# Patient Record
Sex: Male | Born: 1940 | Race: Black or African American | Hispanic: No | Marital: Married
Health system: Southern US, Community
[De-identification: ages and names within clinical notes are randomized; demographics above are authoritative.]

---

## 2003-09-18 ENCOUNTER — Ambulatory Visit (HOSPITAL_COMMUNITY): Admission: RE | Admit: 2003-09-18 | Discharge: 2003-09-18 | Payer: Self-pay | Admitting: Orthopedic Surgery

## 2003-09-18 ENCOUNTER — Ambulatory Visit (HOSPITAL_BASED_OUTPATIENT_CLINIC_OR_DEPARTMENT_OTHER): Admission: RE | Admit: 2003-09-18 | Discharge: 2003-09-18 | Payer: Self-pay | Admitting: Orthopedic Surgery

## 2004-12-20 ENCOUNTER — Inpatient Hospital Stay: Payer: Self-pay | Admitting: Internal Medicine

## 2004-12-20 ENCOUNTER — Other Ambulatory Visit: Payer: Self-pay

## 2004-12-21 ENCOUNTER — Other Ambulatory Visit: Payer: Self-pay

## 2004-12-23 ENCOUNTER — Other Ambulatory Visit: Payer: Self-pay

## 2005-02-09 ENCOUNTER — Encounter: Payer: Self-pay | Admitting: Internal Medicine

## 2005-07-14 ENCOUNTER — Other Ambulatory Visit: Payer: Self-pay

## 2005-07-14 ENCOUNTER — Emergency Department: Payer: Self-pay | Admitting: Emergency Medicine

## 2006-03-24 ENCOUNTER — Other Ambulatory Visit: Payer: Self-pay

## 2006-03-24 ENCOUNTER — Inpatient Hospital Stay: Payer: Self-pay | Admitting: Internal Medicine

## 2007-04-17 ENCOUNTER — Ambulatory Visit: Payer: Self-pay | Admitting: Vascular Surgery

## 2008-12-14 ENCOUNTER — Emergency Department: Payer: Self-pay | Admitting: Emergency Medicine

## 2009-02-05 ENCOUNTER — Inpatient Hospital Stay: Payer: Self-pay | Admitting: Internal Medicine

## 2009-03-05 ENCOUNTER — Ambulatory Visit: Payer: Self-pay | Admitting: Internal Medicine

## 2009-11-08 ENCOUNTER — Inpatient Hospital Stay: Payer: Self-pay | Admitting: Internal Medicine

## 2010-01-12 ENCOUNTER — Inpatient Hospital Stay: Payer: Self-pay | Admitting: Internal Medicine

## 2011-01-29 ENCOUNTER — Inpatient Hospital Stay: Payer: Self-pay | Admitting: Internal Medicine

## 2011-03-20 ENCOUNTER — Emergency Department: Payer: Self-pay | Admitting: *Deleted

## 2011-03-30 ENCOUNTER — Ambulatory Visit: Payer: Self-pay | Admitting: Ophthalmology

## 2012-01-15 ENCOUNTER — Emergency Department: Payer: Self-pay | Admitting: Internal Medicine

## 2012-01-15 LAB — COMPREHENSIVE METABOLIC PANEL
Albumin: 3.4 g/dL (ref 3.4–5.0)
Alkaline Phosphatase: 123 U/L (ref 50–136)
Anion Gap: 8 (ref 7–16)
BUN: 17 mg/dL (ref 7–18)
Bilirubin,Total: 0.6 mg/dL (ref 0.2–1.0)
Calcium, Total: 9.2 mg/dL (ref 8.5–10.1)
Co2: 23 mmol/L (ref 21–32)
EGFR (African American): >60
EGFR (Non-African Amer.): 58 — ABNORMAL LOW
Glucose: 368 mg/dL — ABNORMAL HIGH (ref 65–99)
Osmolality: 290 (ref 275–301)
Potassium: 3.7 mmol/L (ref 3.5–5.1)
SGOT(AST): 16 U/L (ref 15–37)

## 2012-01-15 LAB — URINALYSIS, COMPLETE
Bilirubin,UR: NEGATIVE
Blood: NEGATIVE
Glucose,UR: >=500 mg/dL (ref 0–75)
Hyaline Cast: 1
Ketone: NEGATIVE
Nitrite: NEGATIVE
Ph: 5 (ref 4.5–8.0)
RBC,UR: <1 /HPF (ref 0–5)
Specific Gravity: 1.035 (ref 1.003–1.030)
WBC UR: 1 /HPF (ref 0–5)

## 2012-01-15 LAB — CK TOTAL AND CKMB (NOT AT ARMC)
CK, Total: 125 U/L (ref 35–232)
CK-MB: 3.3 ng/mL (ref 0.5–3.6)

## 2012-01-15 LAB — CBC
HCT: 44 % (ref 40.0–52.0)
MCH: 34.3 pg — ABNORMAL HIGH (ref 26.0–34.0)
MCHC: 34.6 g/dL (ref 32.0–36.0)
MCV: 99 fL (ref 80–100)
Platelet: 158 10*3/uL (ref 150–440)
RBC: 4.44 10*6/uL (ref 4.40–5.90)
RDW: 12.9 % (ref 11.5–14.5)
WBC: 5 10*3/uL (ref 3.8–10.6)

## 2012-02-16 ENCOUNTER — Emergency Department: Payer: Self-pay | Admitting: Emergency Medicine

## 2012-02-16 LAB — CBC
HCT: 41.6 % (ref 40.0–52.0)
MCH: 33.5 pg (ref 26.0–34.0)
MCHC: 34.3 g/dL (ref 32.0–36.0)
MCV: 98 fL (ref 80–100)
Platelet: 218 10*3/uL (ref 150–440)
RBC: 4.26 10*6/uL — ABNORMAL LOW (ref 4.40–5.90)
WBC: 6.4 10*3/uL (ref 3.8–10.6)

## 2012-02-16 LAB — COMPREHENSIVE METABOLIC PANEL
Anion Gap: 7 (ref 7–16)
Bilirubin,Total: 0.5 mg/dL (ref 0.2–1.0)
Calcium, Total: 9.2 mg/dL (ref 8.5–10.1)
Chloride: 106 mmol/L (ref 98–107)
Co2: 27 mmol/L (ref 21–32)
Creatinine: 1.02 mg/dL (ref 0.60–1.30)
EGFR (African American): >60
EGFR (Non-African Amer.): >60
Potassium: 3.9 mmol/L (ref 3.5–5.1)
SGOT(AST): 19 U/L (ref 15–37)
SGPT (ALT): 25 U/L
Total Protein: 7.1 g/dL (ref 6.4–8.2)

## 2012-06-22 ENCOUNTER — Inpatient Hospital Stay: Payer: Self-pay | Admitting: Internal Medicine

## 2012-06-22 LAB — CBC WITH DIFFERENTIAL/PLATELET
Basophil %: 1 %
Eosinophil #: 0 10*3/uL (ref 0.0–0.7)
Eosinophil %: 0.6 %
HCT: 44.2 % (ref 40.0–52.0)
HGB: 15.4 g/dL (ref 13.0–18.0)
Lymphocyte #: 1.6 10*3/uL (ref 1.0–3.6)
Lymphocyte %: 20.7 %
MCH: 33.3 pg (ref 26.0–34.0)
MCHC: 34.9 g/dL (ref 32.0–36.0)
MCV: 95 fL (ref 80–100)
Monocyte #: 0.6 x10 3/mm (ref 0.2–1.0)
Neutrophil #: 5.5 10*3/uL (ref 1.4–6.5)
RBC: 4.63 10*6/uL (ref 4.40–5.90)
WBC: 7.8 10*3/uL (ref 3.8–10.6)

## 2012-06-22 LAB — COMPREHENSIVE METABOLIC PANEL
Alkaline Phosphatase: 113 U/L (ref 50–136)
BUN: 21 mg/dL — ABNORMAL HIGH (ref 7–18)
Bilirubin,Total: 1.2 mg/dL — ABNORMAL HIGH (ref 0.2–1.0)
Calcium, Total: 9.3 mg/dL (ref 8.5–10.1)
Chloride: 110 mmol/L — ABNORMAL HIGH (ref 98–107)
Creatinine: 1.01 mg/dL (ref 0.60–1.30)
Osmolality: 291 (ref 275–301)
Potassium: 3.6 mmol/L (ref 3.5–5.1)
SGPT (ALT): 19 U/L (ref 12–78)
Sodium: 143 mmol/L (ref 136–145)
Total Protein: 7.1 g/dL (ref 6.4–8.2)

## 2012-06-22 LAB — CK TOTAL AND CKMB (NOT AT ARMC)
CK, Total: 440 U/L — ABNORMAL HIGH (ref 35–232)
CK-MB: 3.4 ng/mL (ref 0.5–3.6)
CK-MB: 2.8 ng/mL (ref 0.5–3.6)

## 2012-06-22 LAB — TROPONIN I
Troponin-I: <0.02 ng/mL
Troponin-I: <0.02 ng/mL

## 2012-06-22 LAB — ETHANOL: Ethanol: <3 mg/dL

## 2012-06-23 LAB — URINALYSIS, COMPLETE
Bacteria: NONE SEEN
Bilirubin,UR: NEGATIVE
Glucose,UR: NEGATIVE mg/dL (ref 0–75)
Leukocyte Esterase: NEGATIVE
Nitrite: NEGATIVE
Ph: 5 (ref 4.5–8.0)
Protein: 100
RBC,UR: 5 /HPF (ref 0–5)
Specific Gravity: 1.024 (ref 1.003–1.030)
Squamous Epithelial: 1
WBC UR: 1 /HPF (ref 0–5)

## 2012-06-23 LAB — CBC WITH DIFFERENTIAL/PLATELET
Eosinophil %: 0.5 %
HGB: 16.5 g/dL (ref 13.0–18.0)
Lymphocyte %: 25.3 %
MCH: 33.1 pg (ref 26.0–34.0)
MCHC: 34.7 g/dL (ref 32.0–36.0)
MCV: 96 fL (ref 80–100)
Monocyte #: 0.5 x10 3/mm (ref 0.2–1.0)
Monocyte %: 7.2 %
Neutrophil %: 66.1 %
Platelet: 145 10*3/uL — ABNORMAL LOW (ref 150–440)
RBC: 4.97 10*6/uL (ref 4.40–5.90)
WBC: 7.1 10*3/uL (ref 3.8–10.6)

## 2012-06-23 LAB — DRUG SCREEN, URINE

## 2012-06-23 LAB — BASIC METABOLIC PANEL
Anion Gap: 8 (ref 7–16)
BUN: 21 mg/dL — ABNORMAL HIGH (ref 7–18)
Calcium, Total: 9.4 mg/dL (ref 8.5–10.1)
Chloride: 110 mmol/L — ABNORMAL HIGH (ref 98–107)
Co2: 26 mmol/L (ref 21–32)
Creatinine: 0.83 mg/dL (ref 0.60–1.30)
EGFR (African American): >60
EGFR (Non-African Amer.): >60
Glucose: 113 mg/dL — ABNORMAL HIGH (ref 65–99)
Osmolality: 291 (ref 275–301)
Potassium: 3.8 mmol/L (ref 3.5–5.1)
Sodium: 144 mmol/L (ref 136–145)

## 2012-06-23 LAB — LIPID PANEL
Cholesterol: 198 mg/dL (ref 0–200)
HDL Cholesterol: 34 mg/dL — ABNORMAL LOW (ref 40–60)
Ldl Cholesterol, Calc: 137 mg/dL — ABNORMAL HIGH (ref 0–100)
Triglycerides: 133 mg/dL (ref 0–200)
VLDL Cholesterol, Calc: 27 mg/dL (ref 5–40)

## 2012-06-23 LAB — TROPONIN I: Troponin-I: <0.02 ng/mL

## 2012-06-23 LAB — HEMOGLOBIN A1C: Hemoglobin A1C: 8.2 % — ABNORMAL HIGH (ref 4.2–6.3)

## 2012-06-23 LAB — CK TOTAL AND CKMB (NOT AT ARMC)
CK, Total: 344 U/L — ABNORMAL HIGH (ref 35–232)
CK-MB: 3.4 ng/mL (ref 0.5–3.6)

## 2012-06-26 LAB — URINALYSIS, COMPLETE
Glucose,UR: NEGATIVE mg/dL (ref 0–75)
Nitrite: NEGATIVE

## 2012-06-27 LAB — CBC WITH DIFFERENTIAL/PLATELET
Basophil #: 0.1 10*3/uL (ref 0.0–0.1)
Basophil %: 0.4 %
Eosinophil #: 0 10*3/uL (ref 0.0–0.7)
Eosinophil %: 0 %
HGB: 13.9 g/dL (ref 13.0–18.0)
Lymphocyte %: 11.1 %
MCHC: 34.3 g/dL (ref 32.0–36.0)
Neutrophil %: 83.6 %
RBC: 4.24 10*6/uL — ABNORMAL LOW (ref 4.40–5.90)
WBC: 17.9 10*3/uL — ABNORMAL HIGH (ref 3.8–10.6)

## 2012-06-27 LAB — BASIC METABOLIC PANEL
Anion Gap: 7 (ref 7–16)
BUN: 18 mg/dL (ref 7–18)
Chloride: 107 mmol/L (ref 98–107)
Co2: 26 mmol/L (ref 21–32)
Creatinine: 1.43 mg/dL — ABNORMAL HIGH (ref 0.60–1.30)
EGFR (African American): 56 — ABNORMAL LOW
EGFR (Non-African Amer.): 49 — ABNORMAL LOW
Osmolality: 286 (ref 275–301)
Sodium: 140 mmol/L (ref 136–145)

## 2012-06-28 LAB — BASIC METABOLIC PANEL
BUN: 21 mg/dL — ABNORMAL HIGH (ref 7–18)
Calcium, Total: 9.1 mg/dL (ref 8.5–10.1)
Chloride: 107 mmol/L (ref 98–107)
Co2: 24 mmol/L (ref 21–32)
Creatinine: 1.32 mg/dL — ABNORMAL HIGH (ref 0.60–1.30)
EGFR (African American): >60
Potassium: 3.8 mmol/L (ref 3.5–5.1)
Sodium: 139 mmol/L (ref 136–145)

## 2012-06-28 LAB — CBC WITH DIFFERENTIAL/PLATELET
Basophil %: 0.3 %
Eosinophil #: 0 10*3/uL (ref 0.0–0.7)
Eosinophil %: 0.2 %
HGB: 14.3 g/dL (ref 13.0–18.0)
Lymphocyte #: 0.5 10*3/uL — ABNORMAL LOW (ref 1.0–3.6)
MCH: 33.1 pg (ref 26.0–34.0)
MCV: 96 fL (ref 80–100)
Monocyte #: 0.5 x10 3/mm (ref 0.2–1.0)
Monocyte %: 4.3 %
Neutrophil #: 9.6 10*3/uL — ABNORMAL HIGH (ref 1.4–6.5)
Neutrophil %: 90.3 %
RBC: 4.32 10*6/uL — ABNORMAL LOW (ref 4.40–5.90)

## 2012-06-28 LAB — URINE CULTURE

## 2012-06-29 LAB — BASIC METABOLIC PANEL
Anion Gap: 7 (ref 7–16)
BUN: 13 mg/dL (ref 7–18)
Calcium, Total: 8.8 mg/dL (ref 8.5–10.1)
Co2: 24 mmol/L (ref 21–32)
Creatinine: 1.17 mg/dL (ref 0.60–1.30)
EGFR (African American): >60
EGFR (Non-African Amer.): >60
Osmolality: 285 (ref 275–301)
Sodium: 140 mmol/L (ref 136–145)

## 2012-06-30 LAB — BASIC METABOLIC PANEL
Anion Gap: 5 — ABNORMAL LOW (ref 7–16)
BUN: 16 mg/dL (ref 7–18)
Calcium, Total: 8.9 mg/dL (ref 8.5–10.1)
Chloride: 108 mmol/L — ABNORMAL HIGH (ref 98–107)
Co2: 28 mmol/L (ref 21–32)
Creatinine: 1.09 mg/dL (ref 0.60–1.30)
EGFR (African American): >60
EGFR (Non-African Amer.): >60
Glucose: 184 mg/dL — ABNORMAL HIGH (ref 65–99)
Osmolality: 287 (ref 275–301)
Potassium: 3.4 mmol/L — ABNORMAL LOW (ref 3.5–5.1)
Sodium: 141 mmol/L (ref 136–145)

## 2012-07-01 LAB — CULTURE, BLOOD (SINGLE)

## 2012-07-24 ENCOUNTER — Ambulatory Visit: Payer: Self-pay | Admitting: Vascular Surgery

## 2012-07-24 LAB — BASIC METABOLIC PANEL
Anion Gap: 7 (ref 7–16)
BUN: 13 mg/dL (ref 7–18)
Chloride: 104 mmol/L (ref 98–107)
Creatinine: 1.08 mg/dL (ref 0.60–1.30)
EGFR (Non-African Amer.): >60
Glucose: 136 mg/dL — ABNORMAL HIGH (ref 65–99)
Osmolality: 282 (ref 275–301)
Potassium: 4.4 mmol/L (ref 3.5–5.1)
Sodium: 140 mmol/L (ref 136–145)

## 2012-08-21 ENCOUNTER — Ambulatory Visit: Payer: Self-pay | Admitting: Vascular Surgery

## 2012-08-21 LAB — BASIC METABOLIC PANEL
Anion Gap: 10 (ref 7–16)
BUN: 20 mg/dL — ABNORMAL HIGH (ref 7–18)
Calcium, Total: 9.3 mg/dL (ref 8.5–10.1)
Chloride: 106 mmol/L (ref 98–107)
Creatinine: 1.09 mg/dL (ref 0.60–1.30)
EGFR (African American): >60
EGFR (Non-African Amer.): >60
Glucose: 206 mg/dL — ABNORMAL HIGH (ref 65–99)
Osmolality: 290 (ref 275–301)
Potassium: 3.9 mmol/L (ref 3.5–5.1)

## 2012-08-22 ENCOUNTER — Ambulatory Visit: Payer: Self-pay | Admitting: Vascular Surgery

## 2012-08-22 LAB — BASIC METABOLIC PANEL
Anion Gap: 7 (ref 7–16)
BUN: 19 mg/dL — ABNORMAL HIGH (ref 7–18)
Calcium, Total: 9.4 mg/dL (ref 8.5–10.1)
Chloride: 107 mmol/L (ref 98–107)
Co2: 28 mmol/L (ref 21–32)
Creatinine: 1.08 mg/dL (ref 0.60–1.30)
EGFR (African American): >60
EGFR (Non-African Amer.): >60
Glucose: 113 mg/dL — ABNORMAL HIGH (ref 65–99)
Osmolality: 286 (ref 275–301)

## 2012-08-22 LAB — CBC
HCT: 37.8 % — ABNORMAL LOW (ref 40.0–52.0)
MCH: 32.2 pg (ref 26.0–34.0)
MCV: 95 fL (ref 80–100)
Platelet: 282 10*3/uL (ref 150–440)
RDW: 13.4 % (ref 11.5–14.5)

## 2012-08-24 ENCOUNTER — Ambulatory Visit: Payer: Self-pay | Admitting: Vascular Surgery

## 2012-08-30 ENCOUNTER — Other Ambulatory Visit: Payer: Self-pay | Admitting: Podiatry

## 2012-09-04 LAB — WOUND CULTURE

## 2012-09-05 ENCOUNTER — Inpatient Hospital Stay: Payer: Self-pay | Admitting: Vascular Surgery

## 2012-09-06 LAB — BASIC METABOLIC PANEL
BUN: 14 mg/dL (ref 7–18)
Chloride: 103 mmol/L (ref 98–107)
Co2: 27 mmol/L (ref 21–32)
EGFR (African American): >60
EGFR (Non-African Amer.): >60
Glucose: 230 mg/dL — ABNORMAL HIGH (ref 65–99)
Osmolality: 282 (ref 275–301)
Sodium: 137 mmol/L (ref 136–145)

## 2012-09-06 LAB — CBC WITH DIFFERENTIAL/PLATELET
Basophil %: 0.3 %
Eosinophil #: 0.2 10*3/uL (ref 0.0–0.7)
Eosinophil %: 2 %
HCT: 27.3 % — ABNORMAL LOW (ref 40.0–52.0)
HGB: 9.1 g/dL — ABNORMAL LOW (ref 13.0–18.0)
Lymphocyte #: 1.3 10*3/uL (ref 1.0–3.6)
Lymphocyte %: 13.3 %
MCHC: 33.4 g/dL (ref 32.0–36.0)
Monocyte %: 7.5 %
Neutrophil #: 7.3 10*3/uL — ABNORMAL HIGH (ref 1.4–6.5)
Neutrophil %: 76.9 %
RBC: 2.86 10*6/uL — ABNORMAL LOW (ref 4.40–5.90)
WBC: 9.5 10*3/uL (ref 3.8–10.6)

## 2012-09-09 LAB — BASIC METABOLIC PANEL
Anion Gap: 9 (ref 7–16)
Calcium, Total: 8.9 mg/dL (ref 8.5–10.1)
Co2: 26 mmol/L (ref 21–32)
Creatinine: 0.96 mg/dL (ref 0.60–1.30)
EGFR (African American): >60
Glucose: 86 mg/dL (ref 65–99)
Osmolality: 282 (ref 275–301)

## 2012-09-09 LAB — CBC WITH DIFFERENTIAL/PLATELET
Basophil #: 0.1 10*3/uL (ref 0.0–0.1)
Eosinophil #: 0.1 10*3/uL (ref 0.0–0.7)
Eosinophil %: 1.1 %
Lymphocyte #: 1.8 10*3/uL (ref 1.0–3.6)
Lymphocyte %: 16.5 %
MCHC: 32.9 g/dL (ref 32.0–36.0)
MCV: 95 fL (ref 80–100)
Monocyte #: 0.8 x10 3/mm (ref 0.2–1.0)
Monocyte %: 6.9 %
Neutrophil %: 75 %
Platelet: 499 10*3/uL — ABNORMAL HIGH (ref 150–440)
RBC: 2.71 10*6/uL — ABNORMAL LOW (ref 4.40–5.90)
RDW: 13.7 % (ref 11.5–14.5)

## 2012-09-11 LAB — CBC WITH DIFFERENTIAL/PLATELET
Basophil #: 0 10*3/uL (ref 0.0–0.1)
Basophil %: 0.4 %
Eosinophil #: 0.1 10*3/uL (ref 0.0–0.7)
Eosinophil %: 1.1 %
HGB: 9 g/dL — ABNORMAL LOW (ref 13.0–18.0)
Lymphocyte #: 1.4 10*3/uL (ref 1.0–3.6)
Lymphocyte %: 13.9 %
MCH: 32 pg (ref 26.0–34.0)
MCHC: 33.9 g/dL (ref 32.0–36.0)
Monocyte #: 0.7 x10 3/mm (ref 0.2–1.0)
Monocyte %: 7.5 %
Neutrophil #: 7.7 10*3/uL — ABNORMAL HIGH (ref 1.4–6.5)
Neutrophil %: 77.1 %
Platelet: 523 10*3/uL — ABNORMAL HIGH (ref 150–440)
RBC: 2.8 10*6/uL — ABNORMAL LOW (ref 4.40–5.90)
RDW: 13.5 % (ref 11.5–14.5)

## 2012-09-11 LAB — BASIC METABOLIC PANEL
Anion Gap: 9 (ref 7–16)
BUN: 9 mg/dL (ref 7–18)
Chloride: 104 mmol/L (ref 98–107)
Co2: 26 mmol/L (ref 21–32)
Creatinine: 0.9 mg/dL (ref 0.60–1.30)
EGFR (African American): >60
EGFR (Non-African Amer.): >60
Glucose: 153 mg/dL — ABNORMAL HIGH (ref 65–99)
Osmolality: 279 (ref 275–301)
Potassium: 3.8 mmol/L (ref 3.5–5.1)
Sodium: 139 mmol/L (ref 136–145)

## 2012-09-11 LAB — PATHOLOGY REPORT

## 2012-09-23 ENCOUNTER — Ambulatory Visit: Payer: Self-pay | Admitting: Internal Medicine

## 2012-10-07 ENCOUNTER — Inpatient Hospital Stay: Payer: Self-pay | Admitting: Internal Medicine

## 2012-10-07 LAB — URINALYSIS, COMPLETE
Bilirubin,UR: NEGATIVE
Glucose,UR: 50 mg/dL (ref 0–75)
Ketone: NEGATIVE
Leukocyte Esterase: NEGATIVE
Nitrite: NEGATIVE
Protein: 100
RBC,UR: 2 /HPF (ref 0–5)
Specific Gravity: 1.025 (ref 1.003–1.030)

## 2012-10-07 LAB — COMPREHENSIVE METABOLIC PANEL
Albumin: 2.1 g/dL — ABNORMAL LOW (ref 3.4–5.0)
Anion Gap: 8 (ref 7–16)
BUN: 23 mg/dL — ABNORMAL HIGH (ref 7–18)
Bilirubin,Total: 0.8 mg/dL (ref 0.2–1.0)
Calcium, Total: 9.5 mg/dL (ref 8.5–10.1)
Chloride: 103 mmol/L (ref 98–107)
Creatinine: 1.09 mg/dL (ref 0.60–1.30)
EGFR (African American): >60
Glucose: 221 mg/dL — ABNORMAL HIGH (ref 65–99)
Osmolality: 286 (ref 275–301)
SGOT(AST): 152 U/L — ABNORMAL HIGH (ref 15–37)
SGPT (ALT): 136 U/L — ABNORMAL HIGH (ref 12–78)

## 2012-10-07 LAB — CK TOTAL AND CKMB (NOT AT ARMC)
CK, Total: 1633 U/L — ABNORMAL HIGH (ref 35–232)
CK-MB: 5.9 ng/mL — ABNORMAL HIGH (ref 0.5–3.6)

## 2012-10-07 LAB — CBC
HGB: 9.8 g/dL — ABNORMAL LOW (ref 13.0–18.0)
MCH: 30.7 pg (ref 26.0–34.0)
MCHC: 32.4 g/dL (ref 32.0–36.0)
MCV: 95 fL (ref 80–100)
Platelet: 343 10*3/uL (ref 150–440)
RBC: 3.2 10*6/uL — ABNORMAL LOW (ref 4.40–5.90)
WBC: 19.2 10*3/uL — ABNORMAL HIGH (ref 3.8–10.6)

## 2012-10-07 LAB — MAGNESIUM: Magnesium: 2.1 mg/dL

## 2012-10-08 LAB — BASIC METABOLIC PANEL
Creatinine: 1.07 mg/dL (ref 0.60–1.30)
EGFR (Non-African Amer.): >60
Glucose: 225 mg/dL — ABNORMAL HIGH (ref 65–99)

## 2012-10-08 LAB — CBC WITH DIFFERENTIAL/PLATELET
Basophil #: 0.1 10*3/uL (ref 0.0–0.1)
Eosinophil #: 0 10*3/uL (ref 0.0–0.7)
Eosinophil %: 0 %
Lymphocyte #: 1.5 10*3/uL (ref 1.0–3.6)
Lymphocyte %: 8.6 %
MCHC: 32.4 g/dL (ref 32.0–36.0)
MCV: 94 fL (ref 80–100)
Monocyte #: 1.2 x10 3/mm — ABNORMAL HIGH (ref 0.2–1.0)
Monocyte %: 6.6 %
Neutrophil #: 14.8 10*3/uL — ABNORMAL HIGH (ref 1.4–6.5)
Neutrophil %: 84.3 %
RBC: 2.95 10*6/uL — ABNORMAL LOW (ref 4.40–5.90)
WBC: 17.6 10*3/uL — ABNORMAL HIGH (ref 3.8–10.6)

## 2012-10-08 LAB — CK TOTAL AND CKMB (NOT AT ARMC): CK, Total: 1044 U/L — ABNORMAL HIGH (ref 35–232)

## 2012-10-08 LAB — TROPONIN I
Troponin-I: <0.02 ng/mL
Troponin-I: <0.02 ng/mL

## 2012-10-09 LAB — CBC WITH DIFFERENTIAL/PLATELET
Basophil #: 0.1 10*3/uL (ref 0.0–0.1)
Eosinophil %: 0.1 %
HCT: 25.4 % — ABNORMAL LOW (ref 40.0–52.0)
HGB: 8.5 g/dL — ABNORMAL LOW (ref 13.0–18.0)
Lymphocyte #: 1.2 10*3/uL (ref 1.0–3.6)
MCH: 31.4 pg (ref 26.0–34.0)
MCHC: 33.3 g/dL (ref 32.0–36.0)
MCV: 95 fL (ref 80–100)
Monocyte #: 0.8 x10 3/mm (ref 0.2–1.0)
Monocyte %: 4.7 %
Neutrophil %: 87.7 %
WBC: 16.7 10*3/uL — ABNORMAL HIGH (ref 3.8–10.6)

## 2012-10-09 LAB — CK: CK, Total: 691 U/L — ABNORMAL HIGH (ref 35–232)

## 2012-10-10 LAB — CBC WITH DIFFERENTIAL/PLATELET
Basophil #: 0 10*3/uL (ref 0.0–0.1)
Basophil %: 0.3 %
HCT: 23.1 % — ABNORMAL LOW (ref 40.0–52.0)
Lymphocyte #: 1.4 10*3/uL (ref 1.0–3.6)
Lymphocyte %: 10.9 %
MCH: 30.1 pg (ref 26.0–34.0)
MCHC: 32 g/dL (ref 32.0–36.0)
Monocyte #: 1 x10 3/mm (ref 0.2–1.0)
Neutrophil %: 80.6 %
Platelet: 336 10*3/uL (ref 150–440)
RBC: 2.46 10*6/uL — ABNORMAL LOW (ref 4.40–5.90)
WBC: 12.5 10*3/uL — ABNORMAL HIGH (ref 3.8–10.6)

## 2012-10-10 LAB — VANCOMYCIN, TROUGH: Vancomycin, Trough: 15 ug/mL (ref 10–20)

## 2012-10-11 LAB — CBC WITH DIFFERENTIAL/PLATELET
Basophil %: 0.3 %
Eosinophil #: 0.1 10*3/uL (ref 0.0–0.7)
Eosinophil %: 0.5 %
Lymphocyte #: 2 10*3/uL (ref 1.0–3.6)
MCH: 29.4 pg (ref 26.0–34.0)
MCV: 94 fL (ref 80–100)
Neutrophil #: 12.4 10*3/uL — ABNORMAL HIGH (ref 1.4–6.5)
Neutrophil %: 80.3 %
RBC: 2.46 10*6/uL — ABNORMAL LOW (ref 4.40–5.90)
RDW: 15.3 % — ABNORMAL HIGH (ref 11.5–14.5)
WBC: 15.5 10*3/uL — ABNORMAL HIGH (ref 3.8–10.6)

## 2012-10-12 LAB — CBC WITH DIFFERENTIAL/PLATELET
Eosinophil #: 0 10*3/uL (ref 0.0–0.7)
Eosinophil %: 0.2 %
HCT: 21.6 % — ABNORMAL LOW (ref 40.0–52.0)
MCH: 30.2 pg (ref 26.0–34.0)
MCHC: 31.9 g/dL — ABNORMAL LOW (ref 32.0–36.0)
Monocyte #: 0.8 x10 3/mm (ref 0.2–1.0)
Monocyte %: 5.2 %
RBC: 2.28 10*6/uL — ABNORMAL LOW (ref 4.40–5.90)
RDW: 15.4 % — ABNORMAL HIGH (ref 11.5–14.5)
WBC: 15.5 10*3/uL — ABNORMAL HIGH (ref 3.8–10.6)

## 2012-10-12 LAB — BASIC METABOLIC PANEL
Anion Gap: 7 (ref 7–16)
Calcium, Total: 7.8 mg/dL — ABNORMAL LOW (ref 8.5–10.1)
Co2: 23 mmol/L (ref 21–32)
EGFR (African American): 50 — ABNORMAL LOW
EGFR (Non-African Amer.): 43 — ABNORMAL LOW
Glucose: 406 mg/dL — ABNORMAL HIGH (ref 65–99)
Potassium: 3.3 mmol/L — ABNORMAL LOW (ref 3.5–5.1)
Sodium: 148 mmol/L — ABNORMAL HIGH (ref 136–145)

## 2012-10-12 LAB — WOUND CULTURE

## 2012-10-13 LAB — BASIC METABOLIC PANEL
Anion Gap: 8 (ref 7–16)
Chloride: 114 mmol/L — ABNORMAL HIGH (ref 98–107)
Co2: 23 mmol/L (ref 21–32)
EGFR (African American): 44 — ABNORMAL LOW
EGFR (Non-African Amer.): 38 — ABNORMAL LOW
Glucose: 385 mg/dL — ABNORMAL HIGH (ref 65–99)
Osmolality: 309 (ref 275–301)
Sodium: 145 mmol/L (ref 136–145)

## 2012-10-13 LAB — CBC WITH DIFFERENTIAL/PLATELET
Basophil #: 0 10*3/uL (ref 0.0–0.1)
Basophil %: 0.2 %
Eosinophil #: 1.2 10*3/uL — ABNORMAL HIGH (ref 0.0–0.7)
Eosinophil %: 7.2 %
MCH: 31.7 pg (ref 26.0–34.0)
MCV: 93 fL (ref 80–100)
Monocyte #: 0.5 x10 3/mm (ref 0.2–1.0)
Monocyte %: 3.2 %
Neutrophil #: 13.7 10*3/uL — ABNORMAL HIGH (ref 1.4–6.5)
Neutrophil %: 82.2 %
Platelet: 381 10*3/uL (ref 150–440)
RBC: 2.45 10*6/uL — ABNORMAL LOW (ref 4.40–5.90)
RDW: 16.6 % — ABNORMAL HIGH (ref 11.5–14.5)

## 2012-10-13 LAB — IRON AND TIBC
Iron Bind.Cap.(Total): 93 ug/dL — ABNORMAL LOW (ref 250–450)
Iron Saturation: 26 %
Iron: 24 ug/dL — ABNORMAL LOW (ref 65–175)
Unbound Iron-Bind.Cap.: 69 ug/dL

## 2012-10-13 LAB — PROTIME-INR
INR: 1.3
Prothrombin Time: 16.3 secs — ABNORMAL HIGH (ref 11.5–14.7)

## 2012-10-13 LAB — CULTURE, BLOOD (SINGLE)

## 2012-10-14 LAB — CBC WITH DIFFERENTIAL/PLATELET
Basophil #: 0 10*3/uL (ref 0.0–0.1)
Basophil %: 0.2 %
Eosinophil #: 0.1 10*3/uL (ref 0.0–0.7)
HGB: 7.7 g/dL — ABNORMAL LOW (ref 13.0–18.0)
Lymphocyte #: 1.7 10*3/uL (ref 1.0–3.6)
Lymphocyte %: 9 %
MCH: 29.5 pg (ref 26.0–34.0)
MCHC: 31.6 g/dL — ABNORMAL LOW (ref 32.0–36.0)
Monocyte #: 0.8 x10 3/mm (ref 0.2–1.0)
Neutrophil %: 86.2 %
Platelet: 440 10*3/uL (ref 150–440)
RBC: 2.62 10*6/uL — ABNORMAL LOW (ref 4.40–5.90)
RDW: 16.3 % — ABNORMAL HIGH (ref 11.5–14.5)
WBC: 18.9 10*3/uL — ABNORMAL HIGH (ref 3.8–10.6)

## 2012-10-14 LAB — PROTIME-INR
INR: 1.3
Prothrombin Time: 16.4 s — ABNORMAL HIGH

## 2012-10-14 LAB — BASIC METABOLIC PANEL
Calcium, Total: 8.6 mg/dL (ref 8.5–10.1)
Chloride: 117 mmol/L — ABNORMAL HIGH (ref 98–107)
EGFR (African American): 35 — ABNORMAL LOW
EGFR (Non-African Amer.): 30 — ABNORMAL LOW
Glucose: 357 mg/dL — ABNORMAL HIGH (ref 65–99)
Sodium: 149 mmol/L — ABNORMAL HIGH (ref 136–145)

## 2012-10-15 LAB — URINALYSIS, COMPLETE
Leukocyte Esterase: NEGATIVE
Nitrite: NEGATIVE
Ph: 5 (ref 4.5–8.0)
RBC,UR: 2 /HPF (ref 0–5)
Specific Gravity: 1.012 (ref 1.003–1.030)
Squamous Epithelial: <1

## 2012-10-15 LAB — BASIC METABOLIC PANEL
Anion Gap: 6 — ABNORMAL LOW (ref 7–16)
Co2: 23 mmol/L (ref 21–32)
Creatinine: 2.14 mg/dL — ABNORMAL HIGH (ref 0.60–1.30)
EGFR (African American): 35 — ABNORMAL LOW
EGFR (Non-African Amer.): 30 — ABNORMAL LOW
Glucose: 549 mg/dL (ref 65–99)
Sodium: 135 mmol/L — ABNORMAL LOW (ref 136–145)

## 2012-10-15 LAB — CBC WITH DIFFERENTIAL/PLATELET
Basophil #: 0 10*3/uL (ref 0.0–0.1)
Eosinophil #: 0.1 10*3/uL (ref 0.0–0.7)
Eosinophil %: 0.3 %
HCT: 23.2 % — ABNORMAL LOW (ref 40.0–52.0)
HGB: 6.9 g/dL — ABNORMAL LOW (ref 13.0–18.0)
Lymphocyte #: 1.7 10*3/uL (ref 1.0–3.6)
MCH: 28.5 pg (ref 26.0–34.0)
Monocyte #: 0.6 x10 3/mm (ref 0.2–1.0)
Monocyte %: 3.1 %
Neutrophil #: 17.1 10*3/uL — ABNORMAL HIGH (ref 1.4–6.5)
RBC: 2.43 10*6/uL — ABNORMAL LOW (ref 4.40–5.90)
RDW: 16.7 % — ABNORMAL HIGH (ref 11.5–14.5)
WBC: 19.5 10*3/uL — ABNORMAL HIGH (ref 3.8–10.6)

## 2012-10-15 LAB — PROTEIN / CREATININE RATIO, URINE
Creatinine, Urine: 80.5 mg/dL (ref 30.0–125.0)
Protein, Random Urine: 127 mg/dL — ABNORMAL HIGH (ref 0–12)
Protein/Creat. Ratio: 1578 mg/gCREAT — ABNORMAL HIGH (ref 0–200)

## 2012-10-15 LAB — PROTIME-INR: INR: 1.4

## 2012-10-16 LAB — CBC WITH DIFFERENTIAL/PLATELET
Eosinophil #: 0.1 10*3/uL (ref 0.0–0.7)
Eosinophil %: 0.7 %
HCT: 22.3 % — ABNORMAL LOW (ref 40.0–52.0)
HGB: 7.3 g/dL — ABNORMAL LOW (ref 13.0–18.0)
Lymphocyte #: 1.5 10*3/uL (ref 1.0–3.6)
MCH: 30 pg (ref 26.0–34.0)
MCHC: 32.7 g/dL (ref 32.0–36.0)
MCV: 92 fL (ref 80–100)
Neutrophil #: 16.4 10*3/uL — ABNORMAL HIGH (ref 1.4–6.5)
Neutrophil %: 86.3 %
Platelet: 372 10*3/uL (ref 150–440)
RBC: 2.43 10*6/uL — ABNORMAL LOW (ref 4.40–5.90)
WBC: 19 10*3/uL — ABNORMAL HIGH (ref 3.8–10.6)

## 2012-10-16 LAB — BASIC METABOLIC PANEL
BUN: 31 mg/dL — ABNORMAL HIGH (ref 7–18)
Chloride: 117 mmol/L — ABNORMAL HIGH (ref 98–107)
Co2: 24 mmol/L (ref 21–32)
Creatinine: 2.69 mg/dL — ABNORMAL HIGH (ref 0.60–1.30)
EGFR (Non-African Amer.): 23 — ABNORMAL LOW
Glucose: 123 mg/dL — ABNORMAL HIGH (ref 65–99)
Osmolality: 302 (ref 275–301)
Potassium: 4 mmol/L (ref 3.5–5.1)

## 2012-10-16 LAB — PROTEIN ELECTROPHORESIS(ARMC)

## 2012-10-16 LAB — PROTIME-INR
INR: 1.6
Prothrombin Time: 19.1 secs — ABNORMAL HIGH (ref 11.5–14.7)

## 2012-10-16 LAB — VANCOMYCIN, RANDOM: Vancomycin, Random: 18 ug/mL

## 2012-10-17 LAB — BASIC METABOLIC PANEL
Anion Gap: 7 (ref 7–16)
BUN: 38 mg/dL — ABNORMAL HIGH (ref 7–18)
Calcium, Total: 8.4 mg/dL — ABNORMAL LOW (ref 8.5–10.1)
Chloride: 120 mmol/L — ABNORMAL HIGH (ref 98–107)
Co2: 24 mmol/L (ref 21–32)
Creatinine: 2.96 mg/dL — ABNORMAL HIGH (ref 0.60–1.30)
EGFR (African American): 23 — ABNORMAL LOW
EGFR (Non-African Amer.): 20 — ABNORMAL LOW
Glucose: 190 mg/dL — ABNORMAL HIGH (ref 65–99)
Osmolality: 314 (ref 275–301)
Sodium: 151 mmol/L — ABNORMAL HIGH (ref 136–145)

## 2012-10-17 LAB — CBC WITH DIFFERENTIAL/PLATELET
Basophil %: 0.3 %
Eosinophil %: 0.3 %
HGB: 7.4 g/dL — ABNORMAL LOW (ref 13.0–18.0)
Lymphocyte #: 1.5 10*3/uL (ref 1.0–3.6)
Lymphocyte %: 8.9 %
MCH: 30.6 pg (ref 26.0–34.0)
Monocyte #: 0.8 x10 3/mm (ref 0.2–1.0)
Monocyte %: 4.7 %
Platelet: 412 10*3/uL (ref 150–440)
RBC: 2.43 10*6/uL — ABNORMAL LOW (ref 4.40–5.90)
WBC: 17.3 10*3/uL — ABNORMAL HIGH (ref 3.8–10.6)

## 2012-10-17 LAB — PROTIME-INR
INR: 1.6
Prothrombin Time: 19.1 secs — ABNORMAL HIGH (ref 11.5–14.7)

## 2012-10-17 LAB — UR PROT ELECTROPHORESIS, URINE RANDOM

## 2012-10-18 LAB — CBC WITH DIFFERENTIAL/PLATELET
Basophil %: 0.3 %
Eosinophil %: 0.7 %
Lymphocyte #: 1.6 10*3/uL (ref 1.0–3.6)
MCH: 29.8 pg (ref 26.0–34.0)
MCHC: 32 g/dL (ref 32.0–36.0)
MCV: 93 fL (ref 80–100)
Monocyte #: 0.8 x10 3/mm (ref 0.2–1.0)
Monocyte %: 5 %
Neutrophil %: 84 %
RBC: 2.41 10*6/uL — ABNORMAL LOW (ref 4.40–5.90)
WBC: 16.3 10*3/uL — ABNORMAL HIGH (ref 3.8–10.6)

## 2012-10-18 LAB — COMPREHENSIVE METABOLIC PANEL
Alkaline Phosphatase: 209 U/L — ABNORMAL HIGH (ref 50–136)
Anion Gap: 5 — ABNORMAL LOW (ref 7–16)
BUN: 35 mg/dL — ABNORMAL HIGH (ref 7–18)
Bilirubin,Total: 0.4 mg/dL (ref 0.2–1.0)
Calcium, Total: 8.3 mg/dL — ABNORMAL LOW (ref 8.5–10.1)
Chloride: 116 mmol/L — ABNORMAL HIGH (ref 98–107)
Co2: 24 mmol/L (ref 21–32)
Creatinine: 3.01 mg/dL — ABNORMAL HIGH (ref 0.60–1.30)
EGFR (African American): 23 — ABNORMAL LOW
EGFR (Non-African Amer.): 20 — ABNORMAL LOW
Glucose: 231 mg/dL — ABNORMAL HIGH (ref 65–99)
Osmolality: 304 (ref 275–301)
Potassium: 3.6 mmol/L (ref 3.5–5.1)
SGOT(AST): 81 U/L — ABNORMAL HIGH (ref 15–37)
SGPT (ALT): 80 U/L — ABNORMAL HIGH (ref 12–78)
Sodium: 145 mmol/L (ref 136–145)
Total Protein: 6.2 g/dL — ABNORMAL LOW (ref 6.4–8.2)

## 2012-10-18 LAB — PROTIME-INR: INR: 1.5

## 2012-10-24 ENCOUNTER — Ambulatory Visit: Payer: Self-pay | Admitting: Internal Medicine

## 2012-10-24 DEATH — deceased

## 2014-04-03 IMAGING — CR RIGHT FOOT COMPLETE - 3+ VIEW
1 series · 3 of 3 positions shown · non-contrast
Comparison: none

REASON FOR EXAM: first toe ulcer
COMMENTS:

PROCEDURE:     DXR - DXR FOOT RT COMPLETE W/OBLIQUES  - October 07, 2012  [DATE]
RESULT:     Comparison: 02/16/2012

[Series 1: oblique · 0.17mm/px · 3 of 3 slices shown]
[im 1/3]
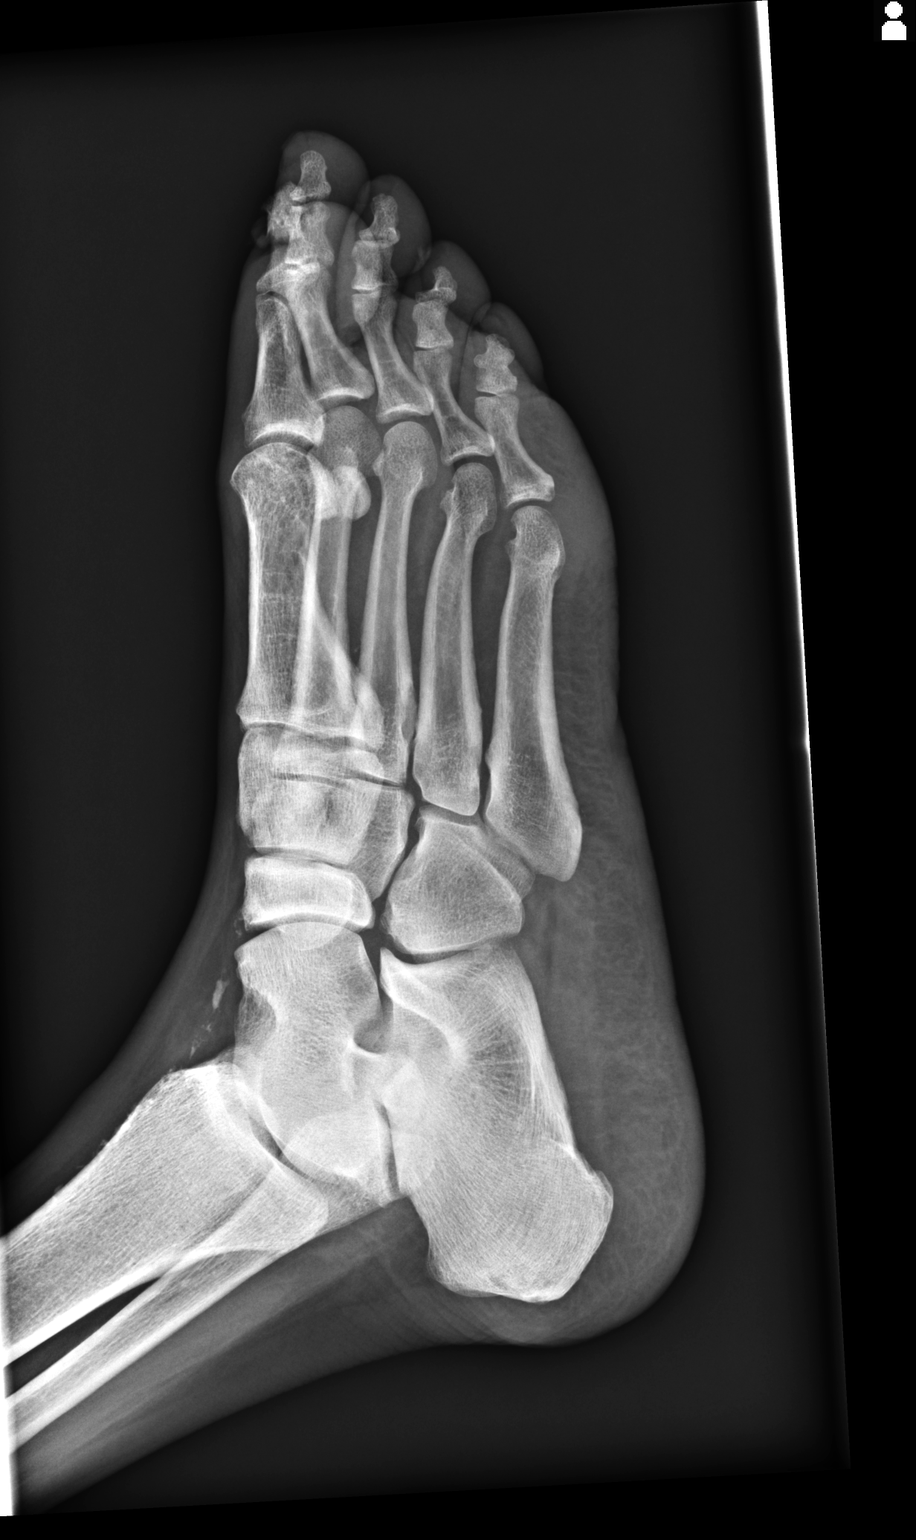
[im 2/3]
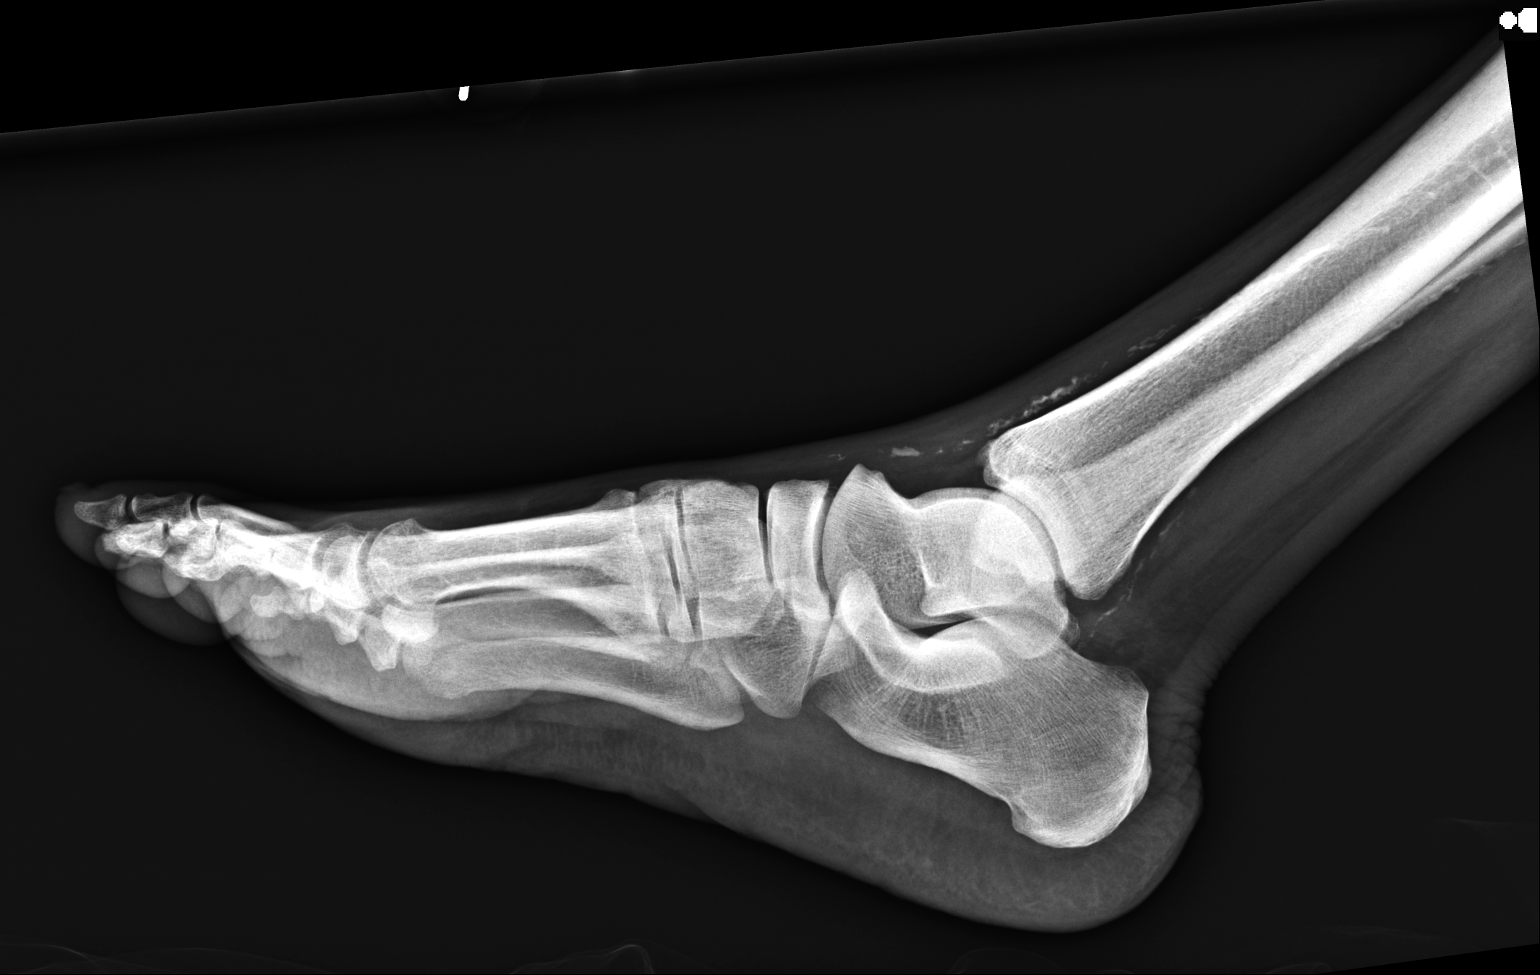
[im 3/3]
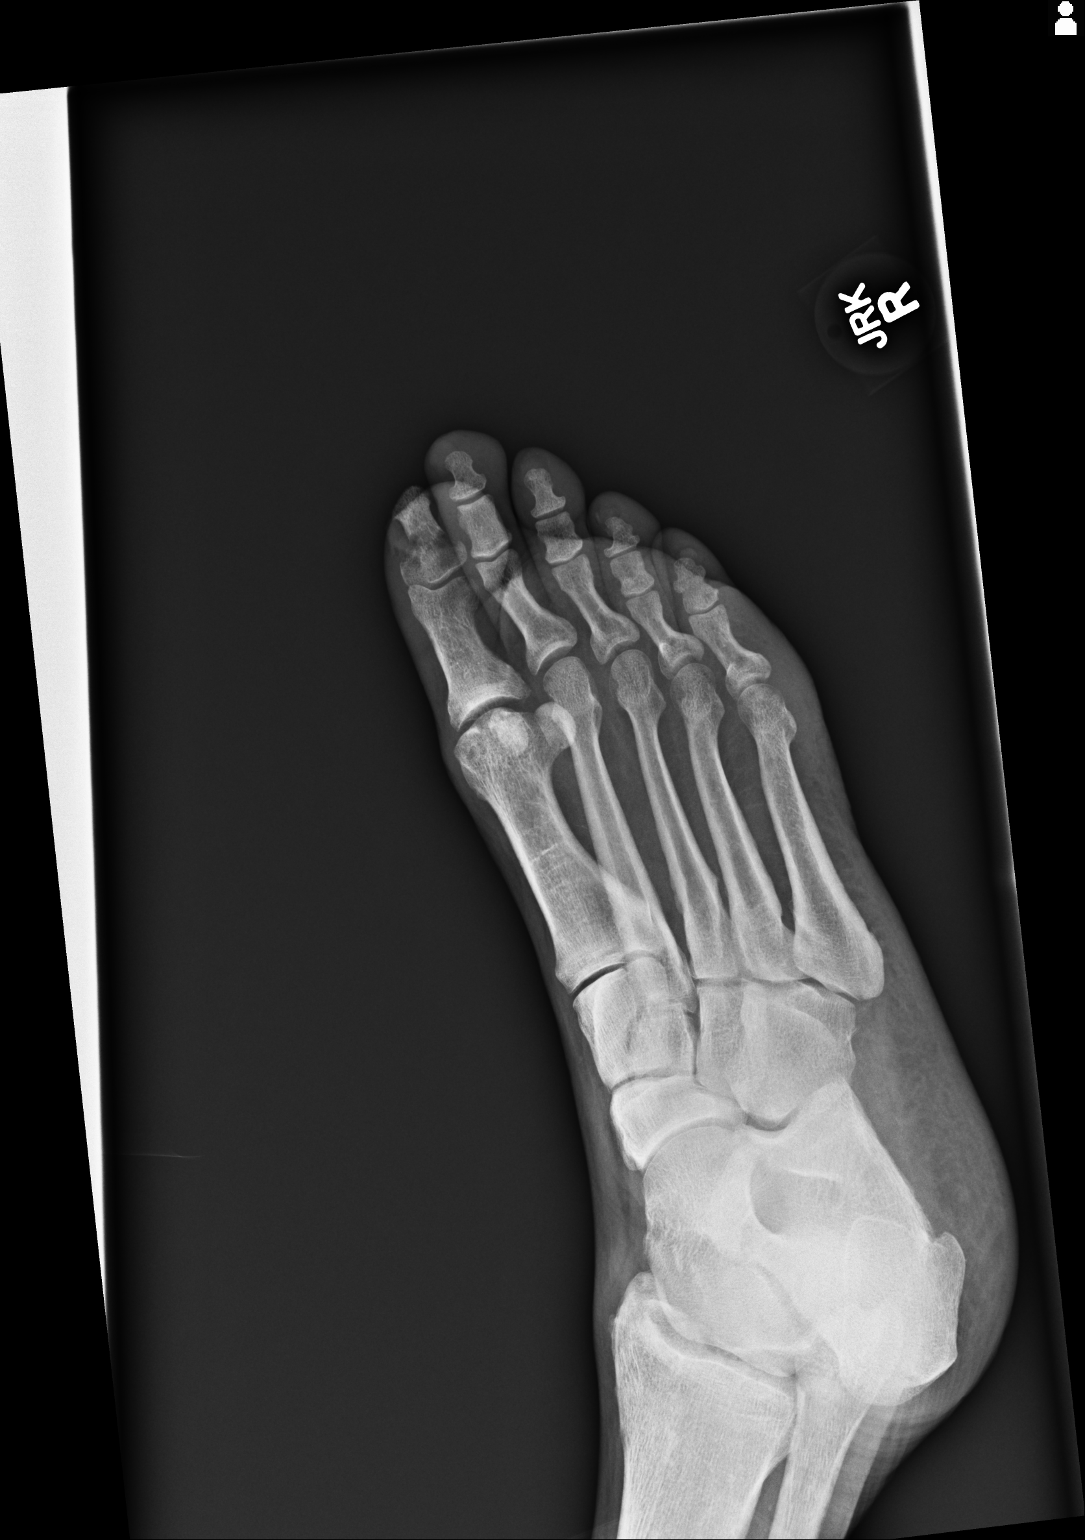

[3 of 3 positions shown; findings below may reference images not displayed]

FINDINGS: There is cortical destruction and lucency involving the mid portion of the
distal phalanx of the great toe. The findings are consistent with
osteomyelitis given the reported great toe ulcer. No soft tissue gas seen.
There are vascular calcifications.
IMPRESSION: Findings consistent with osteomyelitis of the distal phalanx of the great
toe.

[REDACTED]

## 2014-04-03 IMAGING — CT CT CHEST W/ CM
2 series · 15 of 31 positions shown, 19 images · IV contrast (APPLIED)
Comparison: none

REASON FOR EXAM: tachypnea, hypoxia
COMMENTS:

PROCEDURE:     CT  - CT CHEST (FOR PE) W  - October 07, 2012  [DATE]
RESULT:     Comparison: None
TECHNIQUE: Multiple thin section axial images were obtained from the lung
apices to the upper abdomen following 80 ml Isovue 370 intravenous contrast,
according to the PE protocol. These images were also reviewed on a Siemens
multiplanar work station.

[Series 4: soft tissue · axial · 0.73mm/px · z∈[-276,-231]mm · 2 of 99 slices shown]
[im 8/99  mediastinal]
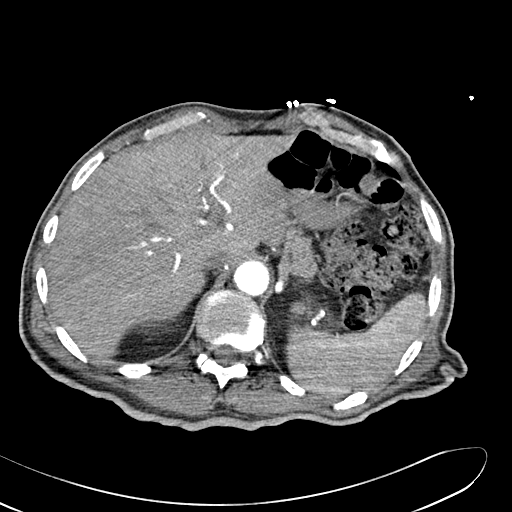
[im 23/99  mediastinal]
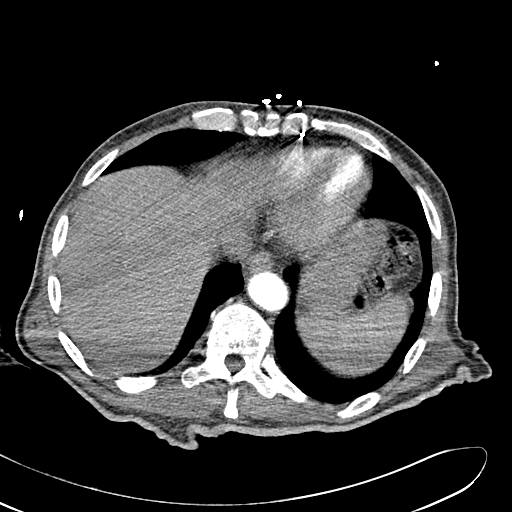

[Series 5: lung windows · axial · 0.73mm/px · z∈[-270,-27]mm · 13 of 97 slices shown, 17 images]
[im 8/97  mediastinal]
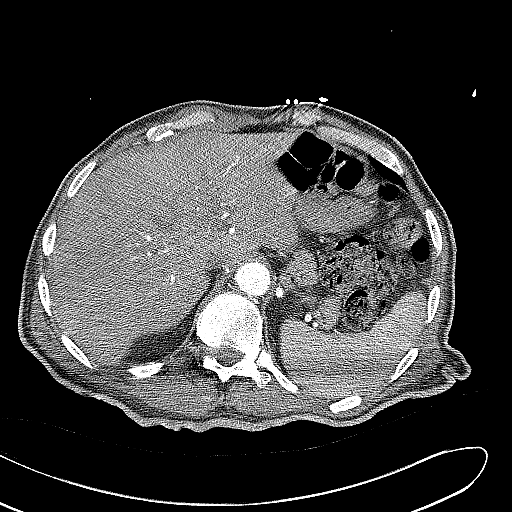
[im 8/97  lung]
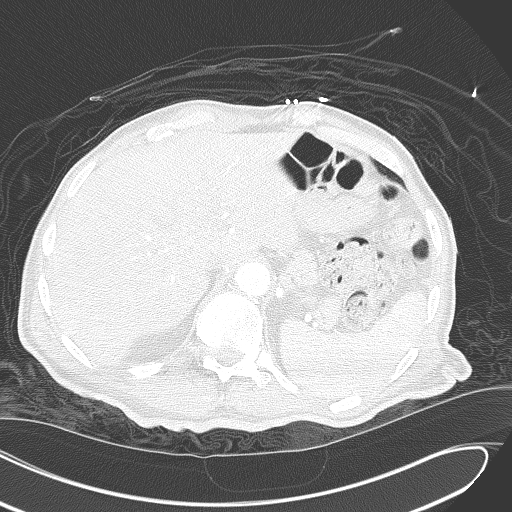
[im 15/97  lung]
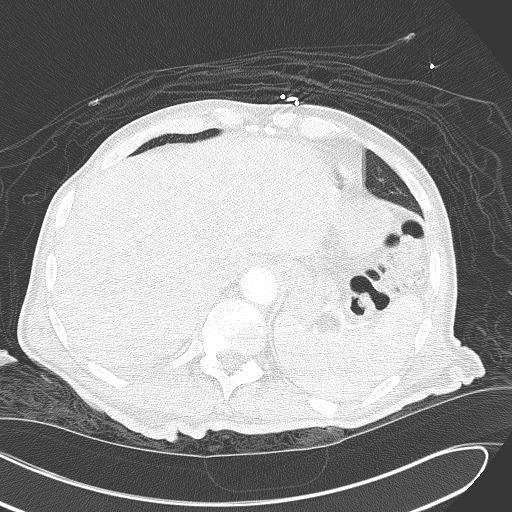
[im 23/97  lung]
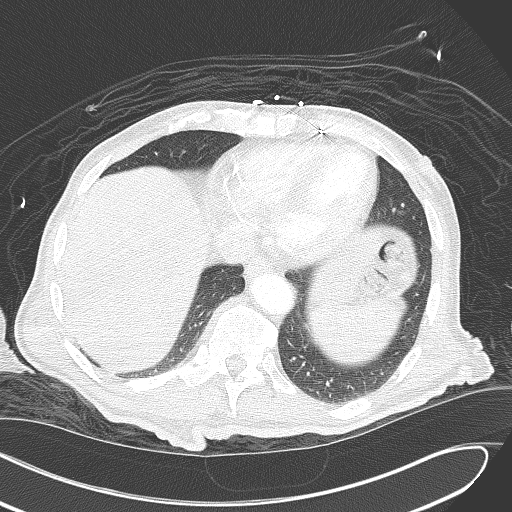
[im 30/97  lung]
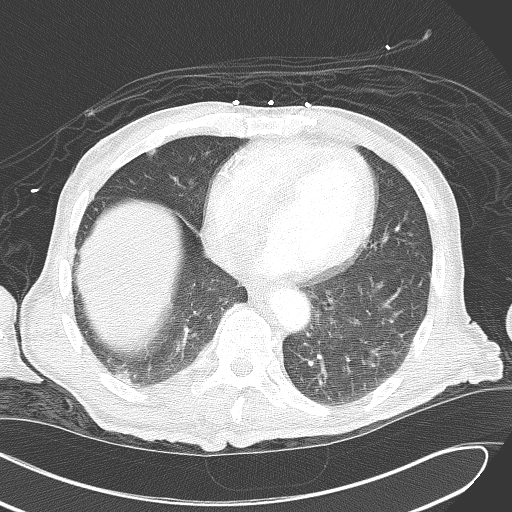
[im 37/97  mediastinal]
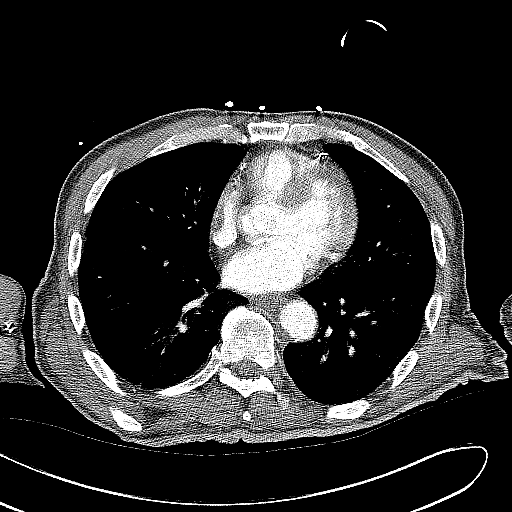
[im 37/97  lung]
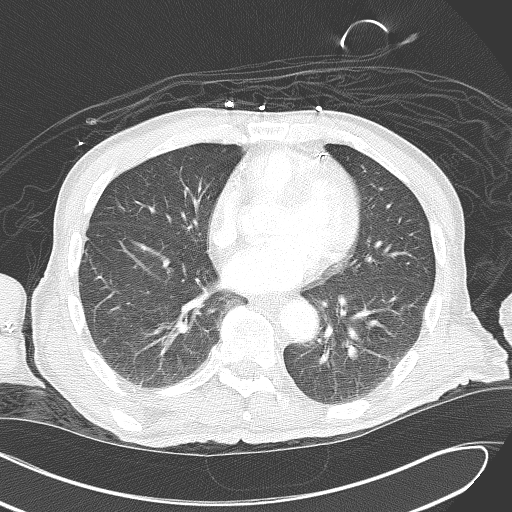
[im 45/97  lung]
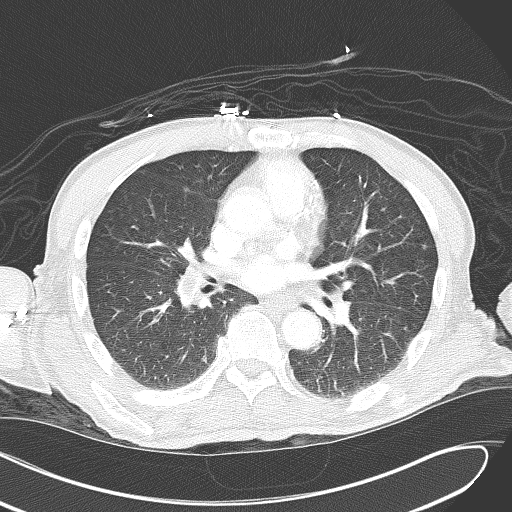
[im 49/97  lung]
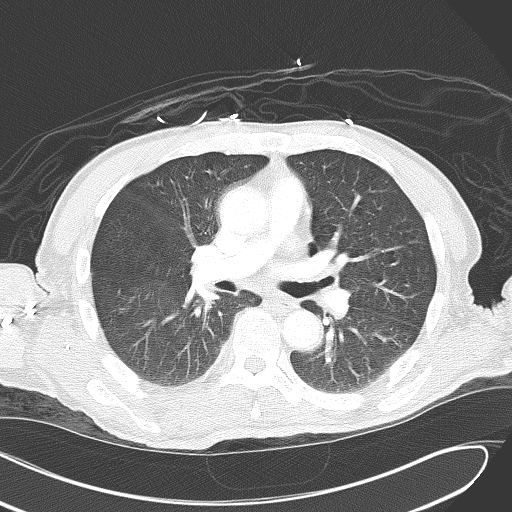
[im 52/97  lung]
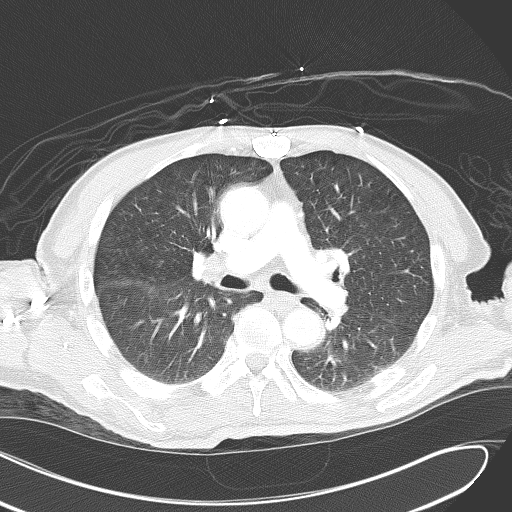
[im 60/97  mediastinal]
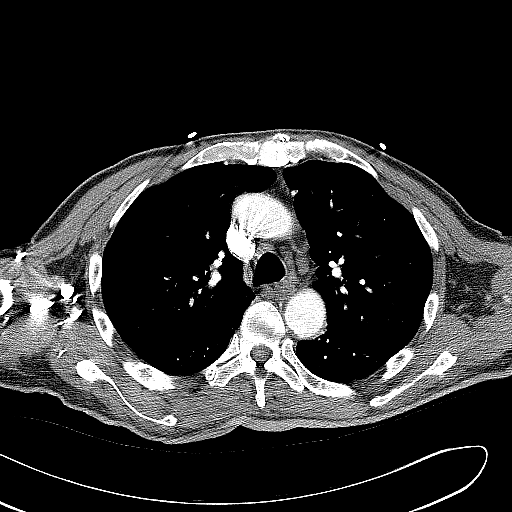
[im 60/97  lung]
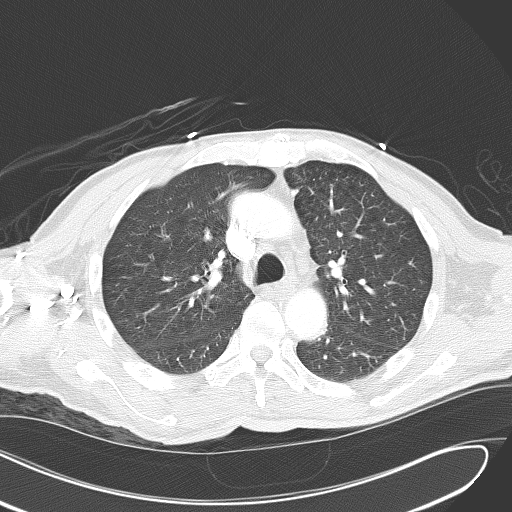
[im 67/97  lung]
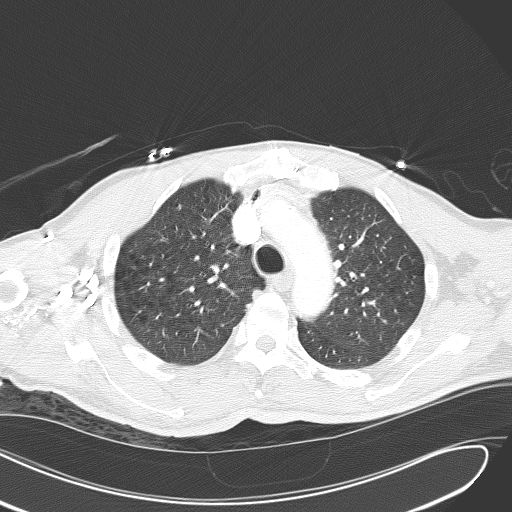
[im 74/97  lung]
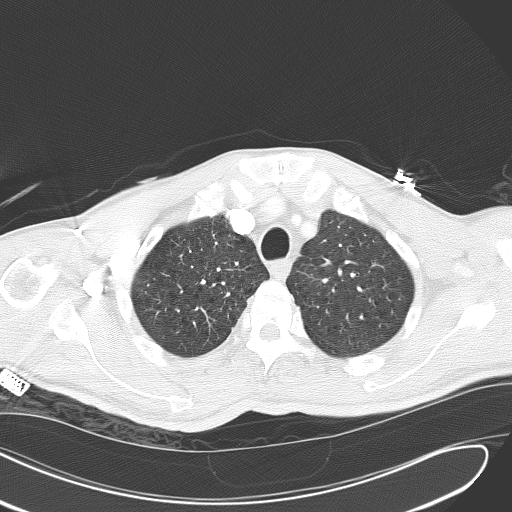
[im 82/97  lung]
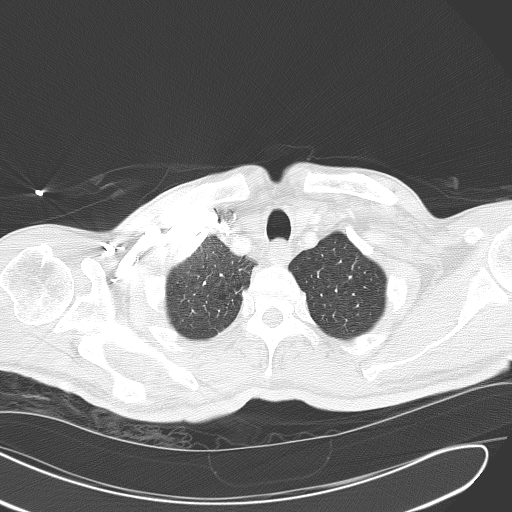
[im 89/97  mediastinal]
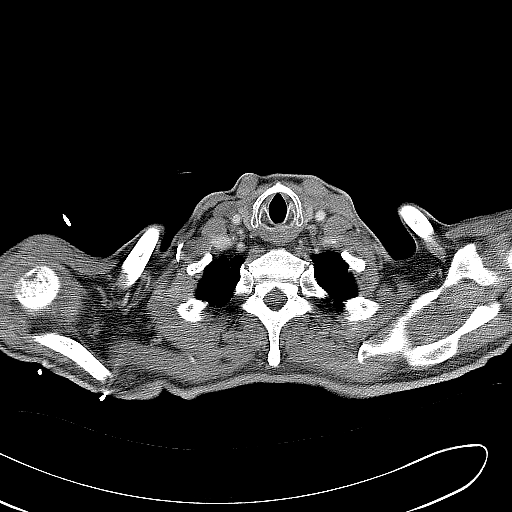
[im 89/97  lung]
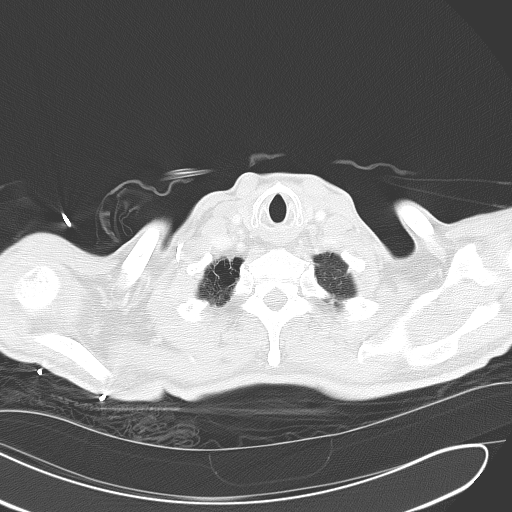

[15 of 31 positions shown; findings below may reference images not displayed]

FINDINGS: There are several prominent, but not pathologically enlarged mediastinal and
right hilar lymph nodes. No axillary lymphadenopathy. Calcifications are
seen in the coronary arteries. There is mild thickening of the left adrenal
gland, without definite mass.

The thoracic aorta is normal in caliber. Evaluation of the segmental
pulmonary arteries is limited. There is a possible small embolus in a left
lower lobe segmental pulmonary artery. There are pulmonary emboli in the
right upper lobe pulmonary artery and the right interlobar pulmonary artery.

Minimal subpleural opacities at the lung apices are likely due to scarring.
There is mild centrilobular emphysema. There is a small, 12 mm nodular
opacity at the right costophrenic angle which is likely secondary to
atelectasis. There are several subcentimeter pulmonary nodules in the
bilateral lungs which are 5 mm or less. The central airways are patent.

No aggressive lytic or sclerotic osseous lesions are identified.
IMPRESSION: 1. Pulmonary emboli in the right upper lobe pulmonary artery and right
interlobar pulmonary artery.
2. Small nodular density at the right costophrenic angle may be secondary to
atelectasis. However, followup noncontrast chest CT is recommended in 3
months to ensure resolution and exclude other etiology.
3. Indeterminate subcentimeter pulmonary nodules which are 5 mm or less.
Recommend attention on the followup chest CT.

Impression 1 and 2 were discussed with Dr. Eddy Bambang Antok at 2677 hours
10/07/2012.

[REDACTED]

## 2014-04-04 IMAGING — US ABDOMEN ULTRASOUND LIMITED
1 series · 14 of 25 positions shown · non-contrast
Comparison: none

REASON FOR EXAM: elevated lfts
COMMENTS:   Body Site: GB and Fossa, CBD, Head of Pancreas; Right Upper
Quad; Live

PROCEDURE:     US  - US ABDOMEN LIMITED SURVEY  - October 08, 2012  [DATE]
RESULT:     Comparison: None.
TECHNIQUE: Multiple grayscale and color Doppler images were obtained of the
right upper quadrant.

[Series 1: abdomen ultrasound limited · 0.21mm/px · 14 of 68 slices shown]
[im 1/68]
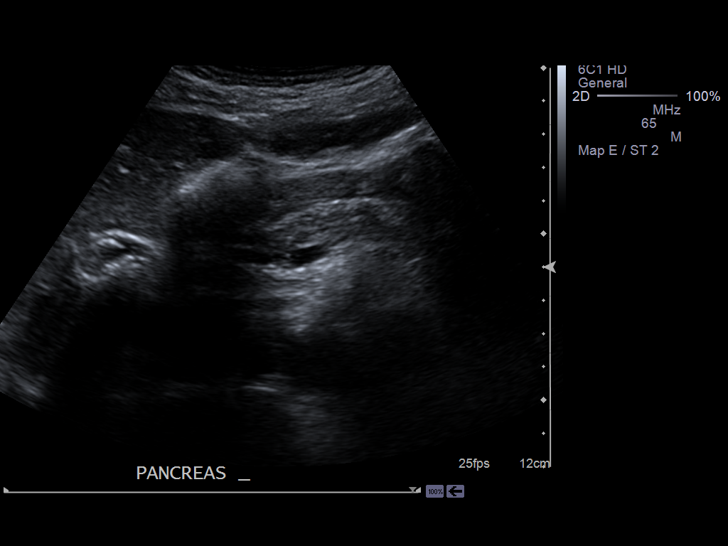
[im 6/68]
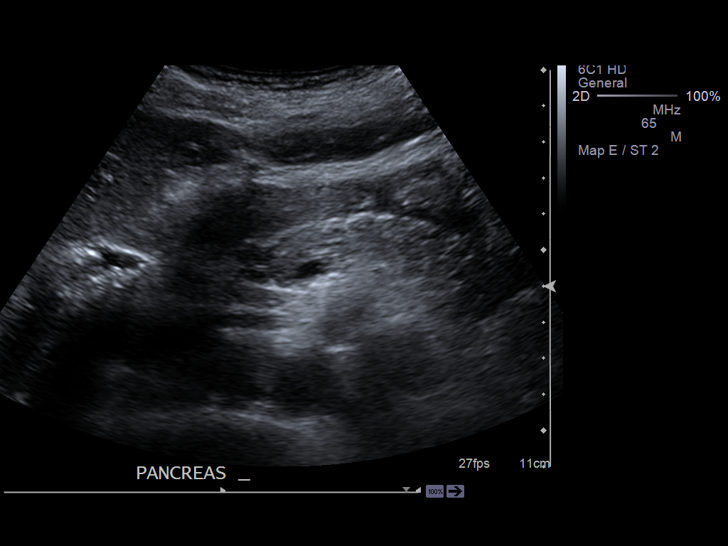
[im 12/68]
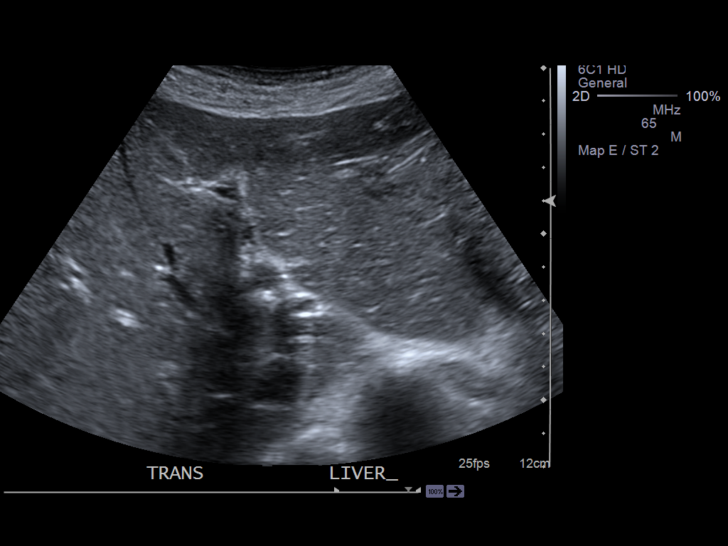
[im 17/68]
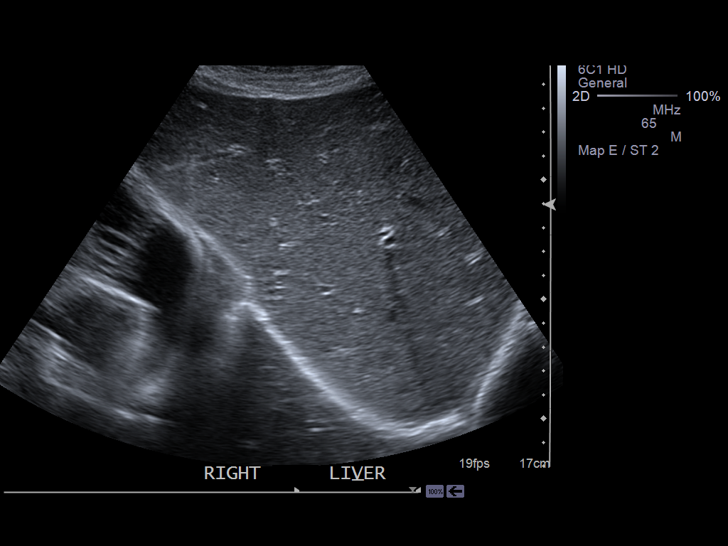
[im 23/68]
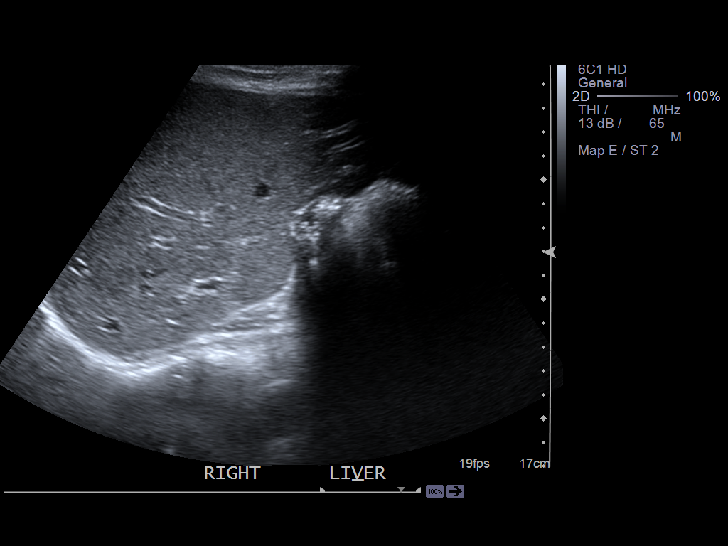
[im 26/68]
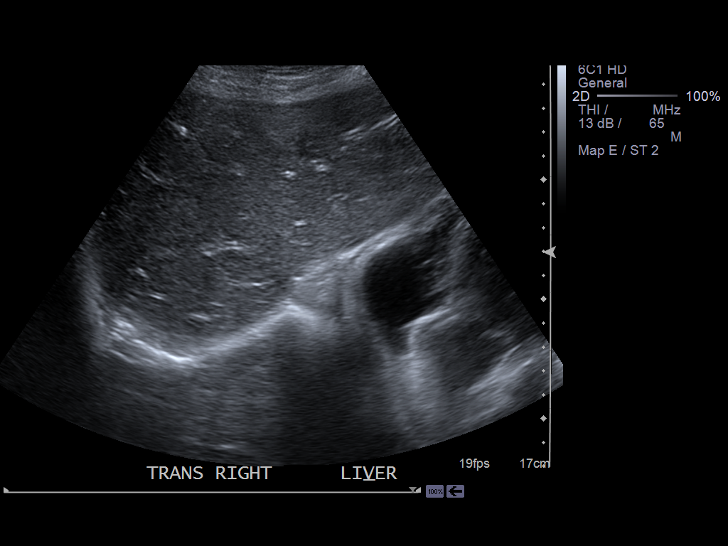
[im 31/68]
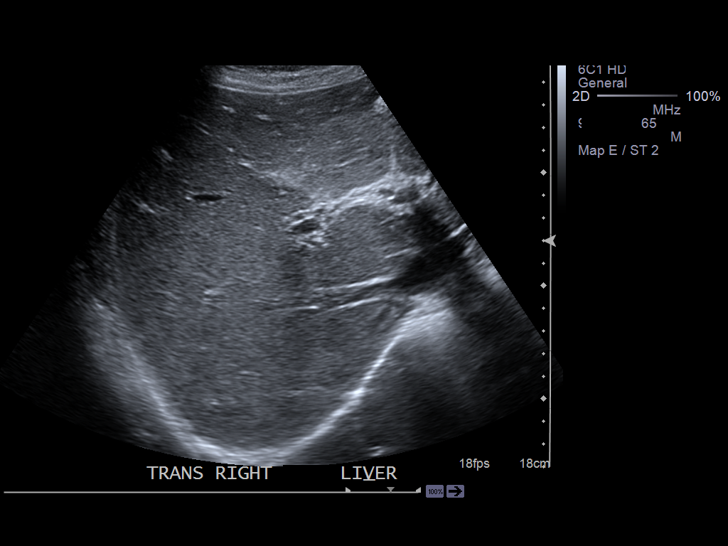
[im 37/68]
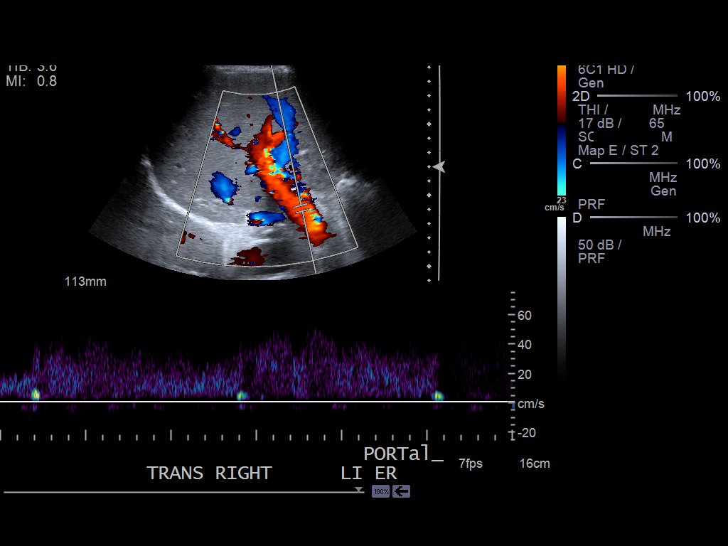
[im 42/68]
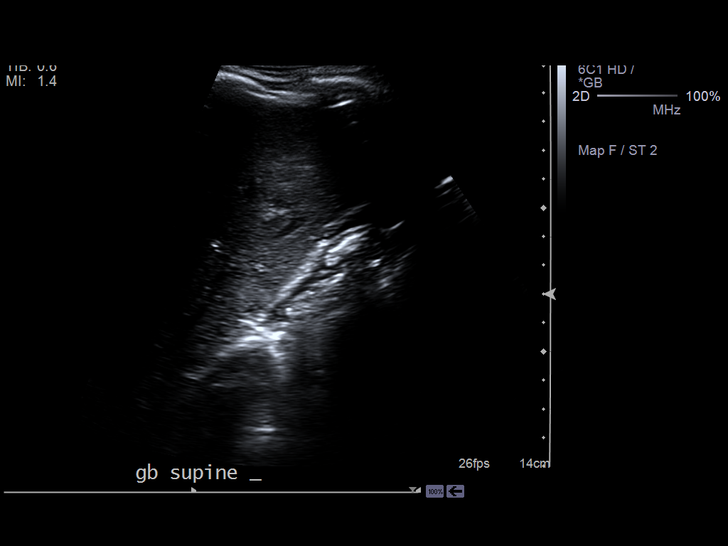
[im 45/68]
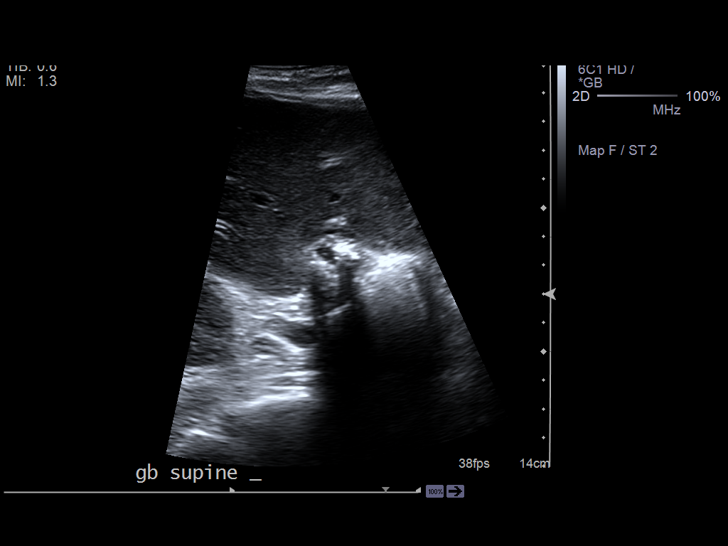
[im 51/68]
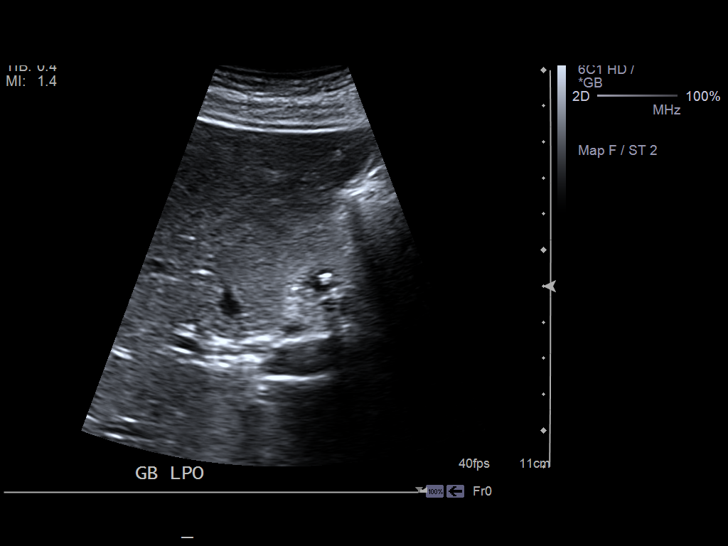
[im 56/68]
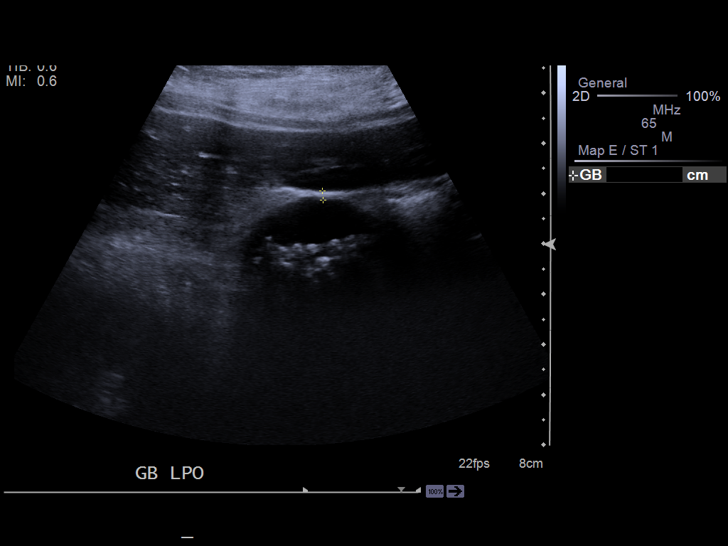
[im 62/68]
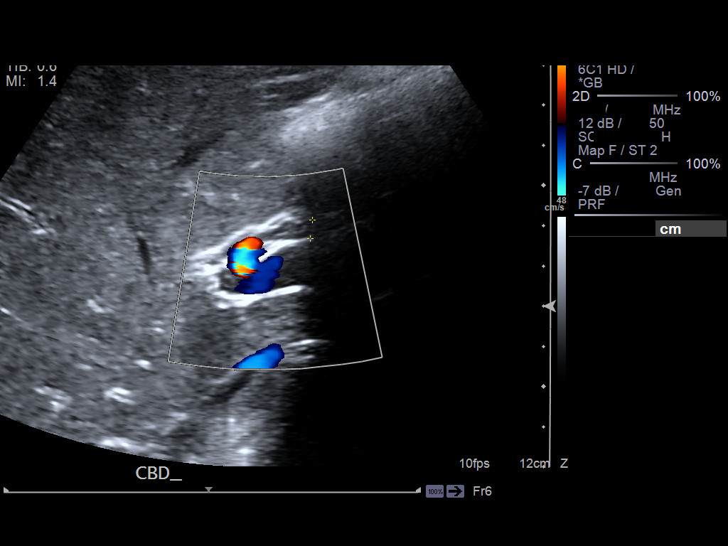
[im 68/68]
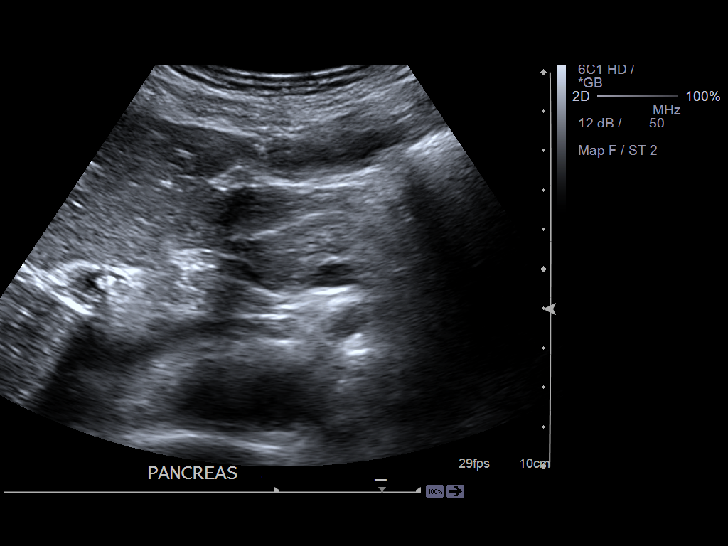

[14 of 25 positions shown; findings below may reference images not displayed]

FINDINGS: The pancreas was partially obscured by overlying bowel gas. The visualized
portion the pancreatic body is unremarkable. The visualized liver is
unremarkable. The main portal vein is patent. The common bile duct measures
4-5 mm in diameter. There are multiple stones within the gallbladder. No
gallbladder wall thickening or pericholecystic fluid. The sonographic Murphy
sign was negative.
IMPRESSION: Cholelithiasis, without sonographic evidence of acute cholecystitis.

## 2014-11-12 NOTE — H&P (Signed)
PATIENT NAME:  Benjamin Hicks, CANTARA MR#:  [id number] DATE OF BIRTH:  09-13-1940  DATE OF ADMISSION:  06/22/2012  ADMITTING PHYSICIAN: Dr. Enid Baas  PRIMARY CARE PHYSICIAN: Dr. Maryellen Pile  CHIEF COMPLAINT: Confusion and right-sided weakness.   HISTORY OF PRESENT ILLNESS: Benjamin Hicks is a 74 year old African American male with past medical history significant for hypertension, diabetes mellitus, history of coronary artery disease status post bypass graft surgery who is a smoker and also has history of alcohol use was brought in and EMS secondary to being confused and difficulty speaking noticed by family this morning. Patient is a very poor historian and also has significant dysarthria and getting frustrated easily at this time. Most of the history is obtained when I called and spoke with his son over the phone. According to his son patient lives at home by himself. Has been doing okay but has been drinking a lot lately. The patient's daughter was trying to reach him all day yesterday. He did not lift the phone and when they checked on him this morning he was found on the floor very confused and also had slurred speech that was difficult to understand and also appeared weaker on his right side which is new. Patient did have prior transient ischemic attacks and mild cerebrovascular accident in the past with very minimal left-sided weakness but none on the right side and his speech was clear and his memory was sharp according to the son in the past. At the current time he does have significant dysarthria and has 4/5 weakness on the right side on exam so he is being admitted for possible cerebrovascular accident. CT of the head here in the ED is negative. Also family does not know patient's medications and not sure if he was taking his medications like he was supposed to.   PAST MEDICAL HISTORY:  1. Diabetes mellitus.  2. Peripheral vascular disease.  3. Cerebrovascular accident with no significant residual  deficits in the past.  4. Hypertension.  5. Coronary artery disease status post bypass graft surgery.   PAST SURGICAL HISTORY:  1. Coronary artery bypass graft surgery.  2. Patient did have stents in his legs in the past for peripheral vascular disease.   ALLERGIES TO MEDICATIONS: No significant medication allergies.   HOME MEDICATIONS: Not sure what kind of medications patient takes at home and also family not sure if he was taking his medications like he was supposed to be. From his old notes it seems like after his bypass graft surgery he was on aspirin and Plavix and also was on insulin and Toprol and a statin in the past.   SOCIAL HISTORY: Lives at home by himself. Has a son and a daughter. He is never married. Smokes about 1-1/2 packs per day and drinks a significant amount of alcohol. Patient or family not able to quantify how much and he drinks both beer and also hard liquors.   FAMILY HISTORY: Diabetes runs in the family.   REVIEW OF SYSTEMS: Difficult to be obtained secondary to patient's confusion at this time.   PHYSICAL EXAMINATION:  VITAL SIGNS: Temperature 98.8 degrees Fahrenheit, pulse 66, respirations 19, blood pressure 156/95, pulse oximetry 98% on room air.   GENERAL: Well built, well nourished male lying in bed, appears very confused and restless at this time.   HEENT: Normocephalic, atraumatic. Pupils equal, round, reacting to activity. Anicteric sclera. Extraocular movements intact. Oropharynx clear without erythema, mass or exudates.   NECK: Supple. No thyromegaly, JVD or bruits. No  lymphadenopathy.   LUNGS: Moving air bilaterally. No wheeze or crackles. No use of accessory muscles for breathing.   CARDIOVASCULAR: S1, S2 regular rate and rhythm. No murmurs, rubs, or gallops.   ABDOMEN: Soft, nontender, nondistended. No hepatosplenomegaly. Normal bowel sounds.   EXTREMITIES: No pedal edema. No clubbing or cyanosis. Feeble dorsalis pedis pulses palpable  bilaterally.   SKIN: No acne, rash, or lesions.   LYMPH: No cervical lymphadenopathy.   NEUROLOGIC: Patient is very confused, is only following some simple commands. He does have mild right facial droop, mostly on the lower half of the face. Speech is dysarthric. Right side is 4/5 strength more weaker in his proximal muscles of upper and lower extremities. The left side strength seems to be 5/5 at this time. Sensory exam can't be done as patient is not able to comprehend well or answer properly.   PSYCH: He seems to be alert and oriented x1.   LABORATORY, DIAGNOSTIC AND RADIOLOGICAL DATA: WBC 7.8, hemoglobin 15.4, hematocrit 44.2, platelet count 156.   Sodium 143, potassium 3.6, chloride 110, bicarbonate 26, BUN 21, creatinine 1.01, glucose 150, calcium 9.3.   ALT 19, AST 26, alkaline phosphatase 113, total bilirubin 1.2, albumin 3.4. CK 505, CK-MB 3.4, troponin less than 0.02. Alcohol level is negative at this time. CT of the head showing chronic involutional changes. No evidence of acute abnormalities. EKG showing normal sinus rhythm with partial right bundle branch block. No acute ST-T wave abnormalities.   ASSESSMENT AND PLAN: 74 year old male with history of hypertension, diabetes, coronary artery disease, prior cerebrovascular accident, smoker and alcohol abuse at home was brought in for confusion and dysarthria.  1. Possible cerebrovascular accident. His symptoms appear to be possible cerebrovascular accident at this time with right-sided weakness, onset unknown and also dysarthria. Extremely confused at this time. No other metabolic causes found and his alcohol level is said to be negative. Will also order a urine tox screen. Will admit the patient to telemetry and do neuro checks, MRI brain, carotid Doppler's, echocardiogram. Continue aspirin and Plavix for now. He was noncompliant to his medications it seems like. The family was asked to bring his home medications. Also continue statin and  lipid profile ordered. Swish and swallow evaluation, physical therapy and care management consult. Likely might need placement at the end of hospital course and family in agreement. 2. Diabetes mellitus. Does not know if he is still on insulin or not. Family was asked to bring his home medications. Patient will be n.p.o. until swallow evaluation is done. Follow-up hemoglobin A1c. Will only cover him with sliding scale insulin at this time. Once home doses are known at least will need to put him on basal insulin.  3. Hypertension. Will allow permissive hypertension due to possible cerebrovascular accident. Hold his other blood pressure medications and we will do IV hydralazine p.r.n.  4. Coronary artery disease status post bypass graft surgery, appears stable for now. Follow-up echocardiogram. On aspirin and Plavix.  5. Tobacco use disorder. Placed on nicotine patch.  6. Alcohol abuse. Placed on CIWA protocol.  7. GI and deep vein thrombosis prophylaxis. Patient on Protonix and also sub-Q heparin.  8. CODE STATUS: FULL CODE.   TIME SPENT ON ADMISSION: 50 minutes.   ____________________________ Enid Baasadhika Carlito Bogert, MD rk:cms D: 06/22/2012 15:59:01 ET T: 06/23/2012 05:26:28 ET JOB#: [id number]  cc: Enid Baasadhika Tramon Crescenzo, MD, <Dictator> Serita ShellerErnest B. Maryellen PileEason, MD Enid BaasADHIKA Boluwatife Mutchler MD ELECTRONICALLY SIGNED 07/02/2012 13:53

## 2014-11-12 NOTE — Discharge Summary (Signed)
PATIENT NAME:  Benjamin Hicks, Benjamin Hicks MR#:  [id number] DATE OF BIRTH:  [birth year]  DATE OF ADMISSION:  06/22/2012 DATE OF DISCHARGE:  06/30/2012  ADMITTING DIAGNOSIS: Stroke.  DISCHARGE DIAGNOSES:  1. Cerebrovascular accident with dysarthria and with right-sided weakness. 2. Severe carotid stenosis, completely occluded right internal carotid artery, 50% to 75% occlusion of left internal carotid artery.  3. Hyperlipidemia.  4. Diabetes mellitus with Hemoglobin A1c 8.2, insulin-dependent.  5. Hypertension. 6. Coronary artery disease status post coronary artery bypass grafting.  7. Cardiomyopathy with ejection fraction of 40% with mild to moderate mitral regurgitation and mild tricuspid regurgitation.  8. Tobacco and alcohol abuse.  9. Escherichia coli extended-spectrum beta-lactamase sepsis.  10. Relative hypotension due to sepsis.  11. Acute pyelonephritis, Escherichia coli extended-spectrum beta-lactamase.  12. Acute renal failure.  13. Tachycardia due to fever, resolved.   DISCHARGE CONDITION: Stable.   DISCHARGE MEDICATIONS: The patient is to continue the following medications. 1. Tylenol 325 mg p.o. daily as needed.  2. Simvastatin 80 mg p.o. at bedtime.  3. Aspirin 81 mg p.o. daily. 4. Clopidogrel 75 mg p.o. daily.  5. Nicotine transdermal patch 21 mg topically daily.  6. Ertapenem 1 gram injection for 10 days.  7. Senna oral tablet 1 tablet once daily.  8. Docusate sodium 100 mg p.o. twice daily.  9. Multivitamin 5 mL daily.  10. Insulin 70/30 15 units subcutaneously twice a day.   HOME OXYGEN: None.   DIET: 2 grams salt, low fat, low cholesterol, carbohydrate diet, mechanical soft.   ACTIVITY LIMITATIONS: As tolerated.   REFERRALS: Physical therapy as well as occupational therapy 2 to 7 times a week.   DISCHARGE FOLLOWUP: Follow-up appointment with Dr. Maryellen Pile in two days after discharge.  CONSULTANTS:  1. Care Management.  2. Festus Barren, MD - Vascular Surgery.   RADIOLOGIC  STUDIES: CT scan of head, 06/22/2012, with no contrast: Chronic involutional changes without evidence of acute abnormalities.   CTA of neck arteries, 06/23/2012: Complete occlusion of right internal carotid artery at the level of carotid bulb. This is not a new finding. There is an approximately 60 to 75% stenosis of proximal left internal carotid artery just distal to the bulb. Calcified and soft plaque is present here. There is flow demonstrated in the vertebral arteries bilaterally. Right vertebral artery is dominant.   Ultrasound of carotid arteries, 06/22/2012: Complete occlusion of right internal carotid artery versus severely high grade stenosis. Findings concerning for hemodynamically significant stenosis within the proximal to mid left internal carotid artery.  MRI of the brain, 06/23/2012: Left thalamic and right cerebellar acute infarct. Severe chronic ischemic changes were also present.   Chest portable single view, 06/26/2012: No acute disease of chest.  Portable single view chest x-ray, 06/28/2012, after PICC line placement:  Right-sided PICC line with the tip projecting over the right atrium. Recommend retracting PICC line 5 cm.  HISTORY OF PRESENT ILLNESS: The patient is a 74 year old African American male with past medical history significant for history of diabetes, insulin-dependent, and history of hypertension as well as hyperlipidemia who presented to the hospital with complaints of confusion as well as right-sided weakness. Please refer to Dr. Thomasena Edis admission note from 06/22/2012.   On arrival to the hospital, the patient's temperature was 98.8, pulse 66, respiration rate 19, blood pressure 156/95, and saturation was 98% on room air. Physical examination was remarkable for confusion. The patient was able to follow only simple commands. He also had mild right facial droop, in the lower half  of the face. Speech was dysarthric. The patient had right-sided weakness with  4/5 strength in the upper extremities and left side was 5/5 at that time. Sensory exam was not performed.  LABS/RADIOLOGIC STUDIES: On 06/22/2012, there was mild elevation of BUN to 21 and glucose level of 150, otherwise BMP was unremarkable. The patient's alcohol level was less than 0.003%. The patient's liver enzymes showed total bilirubin of 1.2 otherwise unremarkable study. Cardiac enzymes x3 were negative. Urine drug screen was negative. CBC was within normal limits.   The patient's urinalysis revealed amber/ hazy urine, negative for glucose or bilirubin, 1+ ketones were noted, specific gravity was noted to be 1.024, pH 5.0, 1+ blood, 1 mg/dL protein, negative for nitrites or leukocyte esterase, 5 red blood cells, and 1 white blood cell. No bacteria was seen. One epithelial cell as well as mucus was present.   EKG showed sinus rhythm at 64 beats minutes and RSR prime or QR pattern in V1 suggesting right ventricular conduction delay was also noted as well as junctional ST depression which was felt to be probably normal. No significant ST-T abnormalities were noted.   CT of head was unremarkable.  HOSPITAL COURSE:  The patient was admitted to the hospital. He was evaluated for possible stroke and he had MRI of his brain which showed infarct in both sides, on the left thalamus as well as right cerebellar area as well as severe chronic ischemic changes. Also, because of carotid ultrasound showing abnormalities in his carotid arteries, CTA was performed and vascular surgery consultation was obtained. After vascular surgery saw the patient, they felt that the patient should continue medical therapy with aspirin as well as Plavix. No surgical therapy was indicated at present. The patient was about to be discharged on 06/25/2012 to skilled nursing facility for rehabilitation; however, he suddenly developed high fevers and we tried to figure out why the patient was having high fevers. We had a chest x-ray done  which was unremarkable. We also had a urinalysis done, on 06/26/2012, which was remarkable for amber cloudy urine, negative for glucose or bilirubin, trace ketones were noted, specific gravity was 1.013, pH was 7.0, 1+ blood, 100 mg/dL protein, negative for nitrites, 2+ leukocyte esterase, 4 red blood cells, and 46 white blood cells were noted. There was 1+ bacteria and no epithelial cells, but mucus was present. Blood cultures were taken and the patient was noted to have one out of four blood cultures positive for Escherichia coli, which was resistant to multiple antibiotics, except sensitive to imipenem as well as ertapenem, ESBL positive. The patient's urine cultures also revealed the same, bacterial Escherichia coli, which was sensitive to imipenem and ertapenem as well as nitrofurantoin. The patient was initially on Rocephin, however, his Rocephin when cultures came back was changed to ertapenem. The patient is to continue ertapenem for 10 days to complete the course. He is not having anymore high fevers, his temperature is stable, and his vitals are stable. On 06/30/2012, he is discharged. His temperature is 98.4, pulse 79, respiration rate 18, blood pressure 144/97, and saturation is 98% on room air at rest. During this time, when he was having fevers, his blood pressure was also somewhat low requiring IV fluid administration. He was noted to have mild acute renal failure with creatinine elevation of 1.43, on 06/27/2012, which responded to IV fluid administration and antibiotic therapy. The patient was also tachycardic, however, his tachycardia resolved with resolution of his fevers. The patient is being discharged in  stable condition with the above-mentioned medications and follow-up. Of note, the patient was evaluated by a speech therapist who recommended mechanical soft diet with thin liquids. He was also evaluated by a physical therapist as well as occupational therapist who recommended rehabilitation  facility. He is somewhat weakened by his infection; however, he is willing to undergo his physical therapy.   TIME SPENT: 40 minutes. ____________________________ Katharina Caper, MD rv:slb D: 06/30/2012 11:19:49 ET T: 06/30/2012 11:56:59 ET JOB#: [id number]  cc: Katharina Caper, MD, <Dictator> Serita Sheller. Maryellen Pile, MD Katharina Caper MD ELECTRONICALLY SIGNED 07/08/2012 16:15

## 2014-11-12 NOTE — Consult Note (Signed)
PATIENT NAME:  Benjamin Hicks, BUER MR#:  [id number] DATE OF BIRTH:  08/17/1940  DATE OF CONSULTATION:  06/23/2012  REFERRING PHYSICIAN:   CONSULTING PHYSICIAN:  Annice Needy, MD  REASON FOR CONSULTATION: Carotid stenosis with stroke.   HISTORY OF PRESENT ILLNESS: This is a 74 year old gentleman who was admitted with right-sided weakness and confusion. He has an MRI positive for strokes in both cerebral hemispheres. He has a previous history of carotid disease, which was known several years ago. His right internal carotid artery was occluded, and his left internal carotid artery had a moderate amount of stenosis in the 60% range. Medical management was recommended. He had multiple ongoing issues including heavy alcohol use and tobacco use. He was in his general typical state of health until a couple of days ago when he became more confused and had right-sided weakness. He is a very poor historian, and this is likely a combination of his acute stroke plus his baseline issues. He has significant dysarthria at this time. His right-sided weakness persists, although it is not profound, and it is not clear if this has improved from his admission status the day before.   PAST MEDICAL HISTORY:  1. Diabetes mellitus.  2. Peripheral vascular disease.  3. Previous stroke with left-sided weakness, which was resolved.  4. Hypertension.  5. Coronary disease, status post coronary bypass grafting.   PAST SURGICAL HISTORY:  1. Percutaneous lower extremity interventions for peripheral vascular disease.  2. Coronary artery bypass grafting.   ALLERGIES: None known.   HOME MEDICATIONS: Unsure. He was supposed to be on aspirin and Plavix as well as Toprol, on a statin, but it does not appear as if he has been taking any of these.   SOCIAL HISTORY: Lives at home by himself. He smokes over a pack per day. He drinks a lot of alcohol including beer and hard liquors. Does this basically every day per report. He cannot really  provide a history.  FAMILY HISTORY: Diabetes runs in the family.  REVIEW OF SYSTEMS: Unobtainable.   PHYSICAL EXAMINATION:  GENERAL: This is a well-developed, well-nourished [place] American male who is sitting quietly in the bed.   VITAL SIGNS: Temperature of 98, pulse of 69, blood pressure is 133/85.   HEENT: Normocephalic and atraumatic. Eyes: Sclerae anicteric. Conjunctivae are clear. Ears: Normal external appearance. Hearing appears to be intact.   NECK: Supple without adenopathy or jugular venous distention. Carotids have bilateral bruits.   HEART: Regular rate and rhythm. Well-healed sternotomy scar.   LUNGS: Clear bilaterally.   ABDOMEN: Soft, nondistended, nontender.   EXTREMITIES: Diminished pedal pulses but feet are warm with good capillary refill.   SKIN: Warm and dry.   NEUROLOGIC: He has severe dysarthria. He is not truly aphasic but he has a marked amount of difficulty getting his words out. His strength appears to be 4 out of five on the right upper extremity and right lower extremity, 5 out of 5 in the left upper extremity and left lower extremity.   LABS: Sodium of 144, potassium 3.8, chloride is 110, CO2 is 26, BUN is 21, creatinine 0.83, glucose is 113. White blood cell count 7.1, hemoglobin 16.5, platelet count is 145,000.  Ultrasound suggested a hemodynamically significant left carotid artery stenosis and a right carotid occlusion. I am not exactly sure what a hemodynamically significant stenosis means by ultrasound report, but the criteria they pose seem to suggest a moderate degree of stenosis. For further evaluation I have ordered a CT angiogram. If  he has a 75% or less stenosis then medical management with aspirin, Plavix, and a statin agent would be recommended. If this is a greater than 75% stenosis then surgical or interventional therapy would be planned. This was discussed the patient and his daughter. The need for continuing to take his medicines and be  compliant with medical care is also discussed. I will follow back after the results of the CT scan are available.   This is a level 4 consultation.     ____________________________ Annice NeedyJason S. Dew, MD jsd:vtd D: 06/24/2012 10:12:00 ET T: 06/24/2012 10:42:14 ET JOB#: [id number]  cc: Annice NeedyJason S. Dew, MD, <Dictator> Annice NeedyJASON S DEW MD ELECTRONICALLY SIGNED 07/20/2012 16:23

## 2014-11-12 NOTE — Consult Note (Signed)
Asked to see patient regarding bilateral strokes seen on MRI and carotid stenosis.  He was seen a couple of years ago, and found to have a right ICA occlusion and a left ICA stenosis in the 60% range.  His US this time suggests right carotid occlusion and a "hemodynamically significant" left ICA stenosis.  Will order a CT angiogram for further evaluation.  If this stenosis has progressed, may consider invasive therapy.  Will return tomorrow after CT angiogram results available to discuss results and determine further treatment options.    Electronic Signatures: Annice Needyew, Mirely Pangle S (MD)  (Signed on 29-Nov-13 18:07)  Authored  Last Updated: 29-Nov-13 18:07 by Annice Needyew, Cristin Szatkowski S (MD)

## 2014-11-15 NOTE — Op Note (Signed)
PATIENT NAME:  Benjamin Hicks, Benjamin Hicks MR#:  [id number] DATE OF BIRTH:  [birth year]  DATE OF PROCEDURE:  09/06/2012  PREOPERATIVE DIAGNOSIS: Diabetic left foot infection with gangrenous changes and MRSA infection.   POSTOPERATIVE DIAGNOSES: Diabetic left foot infection with gangrenous changes and MRSA infection.   PROCEDURE:  Left second and third toe amputations and left lateral midfoot amputation with extensive irrigation and debridement of skin, soft tissue, muscle and bone of the left foot with wet-to-dry dressing placement.   SURGEON: Annice NeedyJason S. Chani Ghanem, MD  ASSISTANT:  Kristin Bruinshelsey Hanne, PA-C   ANESTHESIA: General.   ESTIMATED BLOOD LOSS: Approximately 25 mL.    INDICATION FOR PROCEDURE: This is a 74 year old African American male who was admitted with diabetic left foot infection. He has had previous toe amputation and an MRSA infection is present in his foot. He is brought in for further debridement for clearly nonviable tissue. Risks and benefits were discussed. Informed consent was obtained.  DESCRIPTION OF PROCEDURE: The patient was brought to the operative suite and after an adequate level of general anesthesia was obtained, the left lower extremity was sterilely prepped and draped, a sterile surgical field was created. A wide fishmouth incision was created from the base of the third toe amputation out to the lateral foot where there was clearly purulent material and fluctuance. This went down almost to the ankle. The underside of the foot was not as densely involved and there is significant skin that was able to be saved in this port for potential flap closure later. The TPS oscillating saw was used to transect the metatarsals proximally to remove the bone and clearly nonviable tissue. There was a significant amount of tendon and muscular tissue removed as well. This was done with heavy scissors or electrocautery. The wound was then irrigated and dressed with a wet-to-dry dressing, after hemostasis  achieved. The patient tolerated the procedure well and was taken to the recovery room in stable condition.     ____________________________ Annice NeedyJason S. Violet Seabury, MD jsd:cc D: 09/06/2012 16:07:53 ET T: 09/06/2012 23:10:37 ET JOB#: [id number]  cc: Annice NeedyJason S. Henessy Rohrer, MD, <Dictator> Annice NeedyJASON S Ebubechukwu Jedlicka MD ELECTRONICALLY SIGNED 09/11/2012 16:58

## 2014-11-15 NOTE — Consult Note (Signed)
PATIENT NAME:  Benjamin Hicks, Benjamin Hicks MR#:  [id number] DATE OF BIRTH:  [birth year]  INFECTIOUS DISEASE CONSULTATION  DATE OF CONSULTATION:  10/10/2012  REFERRING PHYSICIAN:  Dr. Elpidio Anis.  REASON FOR CONSULT: Possible osteomyelitis.   HISTORY OF PRESENT ILLNESS: The patient is a 74 year old man with a past history significant for diabetes, stroke, peripheral vascular disease, osteomyelitis status post amputation of several toes, who was admitted on March 15 with shortness of breath and confusion.   The patient is a resident of a skilled nursing facility. He has had a prior stroke and is aphasic, however was noted to have decreased alertness. He was brought to the Emergency Room and found to be hypoxic and short of breath. He was admitted to the hospital and was noted to have elevated leukocytosis. He was diagnosed with a pulmonary embolism by CT scan. Dopplers revealed a DVT as well. X-rays of his right foot show possible osteomyelitis of the right great toe.   He is unable to provide any history, and the history was obtained from the chart. He had recently been in the hospital and had several toes amputated, most recently on February 18.  Following the amputation he did not receive any prolonged IV antibiotics. He is currently on vancomycin and Zosyn. A wound culture from the foot is growing MRSA.   ALLERGIES: None.   PAST MEDICAL HISTORY: 1.  Diabetes.  2.  Stroke, with aphasia and inability to ambulate.  3.  Peripheral vascular disease, status post stent placement.  4.  Hypertension.  5.  Coronary artery disease, status post CABG.  6.  Vascular dementia.  7.  Osteomyelitis of the left foot, with MRSA on culture status post amputation of the third, fourth and fifth toe on the left.   SOCIAL HISTORY: The patient lives in a skilled nursing facility. Prior to his admission in December 2013 he was a smoker, smoking 1-1/2 packs per day, and a regular drinker.   FAMILY HISTORY: Positive for diabetes.    REVIEW OF SYSTEMS: Unable to obtain from the patient due to his prior stroke.   PHYSICAL EXAMINATION: VITAL SIGNS: T-max of 101.4; T-current 98.7, pulse 108, blood pressure 100/65; 100% on  4 liters.  GENERAL: A 74 year old somewhat cachectic black man in no acute distress.  HEENT: Normocephalic, atraumatic, but sunken temples. Pupils equal and reactive to light. Extraocular motions intact. Sclerae, conjunctivae, and lids are without evidence for emboli or petechiae. Oropharynx shows no erythema or exudate. Gums are in fair condition.  NECK: Supple. Full range of motion. Midline trachea. No lymphadenopathy. No thyromegaly.  CHEST: Clear to auscultation bilaterally, with good air movement. No focal consolidation.  CARDIAC: Regular rate and rhythm, without murmur, rub, or gallop.  ABDOMINAL: Soft, nontender, and nondistended. No hepatosplenomegaly. No hernias  noted.  EXTREMITIES: No evidence for tenosynovitis. He had flexure contractures present.  SKIN: No rashes. No stigmata of endocarditis, specifically no Janeway lesions or Osler nodes. On the right foot there was an ulcer. There was no significant drainage from the wound, nor was there significant redness. There was minimal swelling of the right great toe. There was no evidence for lymphangitic streaking. The left foot had several toes removed and a wound VAC was in place.  NEUROLOGIC: The patient was aphasic and had weakness in all 4 extremities. There was muscle wasting, consistent with his prior CVA.  PSYCHIATRIC: Unable to assess due to the patient's nonverbal status.   LABORATORY DATA: Shows a BUN of 22, creatinine 1.07, bicarbonate 27, anion  gap of 6.   LFTs showed an AST of 152, ALT 136, alkaline phosphatase of 274, total bilirubin of 0.8.   CPKs on admission were elevated at 1633, with an MB fraction of 5.9, but troponin was less than 0.02. CPKs have been trending down, and the troponins have remained negative.   His white count  is 12.5, with a hemoglobin of 7.4, platelet count of 336, ANC of 10.1. White count on admission was 19.2.   Blood cultures from admission show no growth. A wound culture from the right great toe is growing MRSA and some yeast.   A urinalysis was unremarkable.   A chest x-ray from admission shows no acute cardiopulmonary disease.   A CT scan of the head without contrast showed no acute process, just chronic small vessel ischemic disease.   Right foot x-rays were consistent with osteomyelitis of the distal phalanx of the great toe.   CT scan of the chest with contrast showed evidence for a right upper lobe pulmonary artery and right interlobar pulmonary emboli. There was a small nodular density at the right costophrenic angle noted as well, and indeterminant subcentimeter pulmonary nodules that are  5 mm or less.  Ultrasound of the abdomen showed cholelithiasis without evidence for acute cholecystitis. Bilateral lower extremity ultrasound showed a nonocclusive DVT of the right common femoral vein and proximal superficial femoral vein. Note there was no evidence for DVT on the left   IMPRESSION: A 74 year old male with a history of diabetes, stroke, peripheral vascular disease, osteomyelitis status post amputation of several toes, admitted with a deep vein thrombosis/pulmonary embolism and possible osteomyelitis of the right great toe.   RECOMMENDATIONS: 1.  His admission with mental status changes and shortness of breath is likely related to his PE. This could also cause a rise in his white count. His x-ray of the right foot showed osteomyelitis of the great toe. While he has a chronic ulcer present, he has not had any prior surgical intervention that would explain the x-ray findings.  2.  I discussed his case with vascular surgery. He will discuss amputation of the toe with the patient/patient's family.  3.  Given his recent osteomyelitis requiring several surgeries and his current infection,  will plan on 6 weeks of IV therapy following amputation.  4.  The cultures have been growing MRSA in the wound. Will continue vancomycin.  5.  The primary team note indicates that he had MRSA in the blood, but I have confirmed with the micro lab that only the wound culture is currently positive for MRSA.   6.  He will likely need 6 weeks of IV therapy.  7.  Will discontinue the Zosyn.  8.  Will continue contact isolation.  9.  There is no need to treat the yeast in the wound.    This is a moderately complex infectious disease case. Thank you very much for involving me in  this patient's care.      ____________________________ Rosalyn GessMichael E. Emunah Texidor, MD meb:dm D: 10/10/2012 13:59:00 ET T: 10/10/2012 14:32:49 ET JOB#: [id number]  cc: Rosalyn GessMichael E. Gwen Edler, MD, <Dictator> Benedetto Ryder E Ugonna Keirsey MD ELECTRONICALLY SIGNED 10/11/2012 15:32

## 2014-11-15 NOTE — H&P (Signed)
PATIENT NAME:  Benjamin Hicks, Benjamin Hicks MR#:  [id number] DATE OF BIRTH:  [birth year]  DATE OF ADMISSION:  10/07/2012  PRIMARY CARE PHYSICIAN:  Dr. Maryellen Pile from [facility].   CHIEF COMPLAINT:  Hypoxia and altered mental status.   HISTORY OF PRESENT ILLNESS:  The patient is a 74 year old African American male with past medical history significant for diabetes mellitus, history of CVA with right cerebellar left thalamic infarct with resulting aphasia and also bilateral lower extremity weakness, history of coronary artery disease, status post bypass graft surgery, peripheral vascular disease with recent MRSA infection of his left foot status post partial forefoot resection three weeks ago, was brought in from [facility] secondary to above-mentioned complaints.  The patient is aphasic and most of the history is obtained from daughter at bedside, according to the daughter at bedside.  According to the daughter, patient normally appears more alert, but she got a call from the rehab place this afternoon saying that he was more confused and so was sent in here.  He was also reportedly hypoxic with sats in the 60s and was placed on nonrebreather mask with sats 80% to 90% when he initially came.  Right now patient appears tachypneic and tachycardic and his sats are 100% on 4 liters oxygen.  He appears restless and still confused in the bed.  His labs indicate that he has an elevated white count, so he is being admitted for possible sepsis with his foot wound being the source of infection.   PAST MEDICAL HISTORY: 1.  History of CVA with right cerebellar and left thalamic infarcts in November 2013 resulting in aphasia and bed-bound status.  2.  Peripheral vascular disease status post stents.  3.  Diabetes mellitus.  4.  Hypertension.  5.  Coronary artery disease, status post bypass graft surgery.  6.  100% right-sided carotid artery stenosis with 50% to 75% left-sided internal carotid artery stenosis.  7.   Possible vascular dementia.  8.  MRSA infection of left foot with dry gangrene status post resection of lateral 3 toes.  PAST SURGICAL HISTORY: 1.  Coronary artery bypass graft surgery.  2.  Peripheral vascular disease with stents in the leg.  3.  Left lateral 3 toes resection with wound vac in place in February 2014.    ALLERGIES TO MEDICATIONS:  No known drug allergies.   CURRENT MEDICATIONS: 1.  Norco 5/325 mg 1 tablet q. 6 hours as needed for pain.  2.  Aspirin 81 mg by mouth daily.  3.  Plavix 75 mg by mouth daily.  4.  Colace 100 mg by mouth twice daily.  5.  Ensure Plus 3 times a day with meals.  6.  Humulin 70/30 insulin 15 units subQ twice a day.  7.  Multivitamin 1 tablet daily.  8.  Senna 1 tablet daily.  9.  Simvastatin 80 mg daily.  10.  Tylenol 650 mg q. 4 hours as needed for pain or fever.   SOCIAL HISTORY:  The patient used to smoke about 1-1/2 packs and also drinks significant amount of alcohol, but stopped since his rehab admission in December 2013.  Prior to that according to daughter, patient used to live with his son and was very functional.  He is separated from his wife, has a son and daughter and the daughter is the oldest child.  According to daughter, the son is not maintaining contact with the patient since his admission to rehab.   FAMILY HISTORY:  Diabetes runs in the family  according to the daughter.   REVIEW OF SYSTEMS:  Not obtained secondary to patient's aphasia and also confusion.   PHYSICAL EXAMINATION: VITAL SIGNS:  Temperature 98.8 degrees Fahrenheit, pulse 130, respirations 24, blood pressure 119/80, pulse ox 100% on 4 liters.  GENERAL:  Well-built and ill-nourished male lying in bed, very restless in appearance and also appears confused.  HEENT:  Normocephalic, atraumatic.  Pupils are equal and round and sluggish reaction to light bilaterally.  Anicteric sclerae.  Extraocular movements intact.  Oropharynx is clear.  No erythema, mass or exudates.   Dry mucous membranes.  NECK:  Supple.  No thyromegaly, JVD or carotid bruits.  No lymphadenopathy.  LUNGS:  Moving air bilaterally.  Decreased bibasilar breath sounds.  No wheeze or crackles.  Slightly use of accessory muscles, especially when trying to talk or anxious.  CARDIOVASCULAR:  S1, S2, regular rate and rhythm, 3 by 6 systolic murmur.  No rubs or gallops.  ABDOMEN:  Soft, nontender, nondistended.  No hepatosplenomegaly.  Normal bowel sounds.  EXTREMITIES:  No pedal edema.  The left foot is warm to touch with dry gangrenous appearance of his first 2 toes and there is a wound vac after resection of bilateral 3 toes.  The right foot is cold to touch and toes are discolored, but sensory motor function seems to be intact.  There is an ulcer of the right first toe with yellowish appearing.  SKIN:  No acne, rash or lesions.  LYMPHATICS:  No cervical lymphadenopathy.  NEUROLOGIC:  The patient is aphasic, able to move both upper extremities, however does have decreased fine motor activity.  He is able to move his lower extremities, but strength appears to be 3 by 5.  There is mild right-sided facial droop, especially in the lower half of the face seen.  PSYCHOLOGICAL:  The patient seems to be oriented to self at this time.   LABORATORY DATA:  WBC is 19.4, hemoglobin is 9.8, hematocrit 30.3, platelet count 343.   Sodium 138, potassium 4.2, chloride 103, bicarb 27, BUN 23, creatinine 1.09, glucose 221 and calcium of 9.5.    ALT 136, AST 152, alkaline phosphatase is 274.  Albumin 2.1.   Magnesium 3.1.  Troponin less than 0.02.  ABG with pH of 7.50, pCO2 34, pO2 152, bicarb of 26.5 and sats of 100% on 4 liters.  Chest x-ray showing clear lung fields.  No acute cardiopulmonary disease.  Urinalysis negative for any infection.  CT head showing old infarcts in the right lobe of thalamus and also bilateral cerebellar infarcts and encephalomalacia, bilateral frontal lobe infarcts and also right parietal  infarct.  Chronic small vessel ischemic disease is present.  EKG showing sinus tachycardia, heart rate of 130 and no acute ST-T wave abnormalities.   ASSESSMENT AND PLAN:  A 74 year old male with past medical history significant for cerebrovascular accident in November 2013 resulting in aphasia, bed-bound state, coronary artery disease status post coronary artery bypass grafting, peripheral vascular disease with recent left foot methicillin resistant staph aureus infection of his toes, status post partial resection three weeks ago, was brought in from nursing home secondary to hypoxemia and possible sepsis.  1.  Acute hypoxic respiratory failure requiring nonrebreather mask.  On arrival sats were in the 30s.  Right now he is saturating 100% on 4 liters and ABG showing respiratory alkalosis.  Chest x-ray is clear.  No evidence of pneumonia, unless there was a questionable aspiration as his diet was advanced to a regular diet per  daughter recently, but he does not have a fever.  Anyway, we will empirically place him on Zosyn.  Also, we will get a CT chest to rule out pulmonary embolus as patient is immobile at baseline.  Continue oxygen support.   2.  Sepsis.  Likely source could be the foot infection, recent methicillin resistant staph aureus infection of left foot status post resection of  last 3 toes with wound vac in place and the second toe appeared to be dry gangrenous at this time.  Also, the right foot, the first toe has a diabetic foot ulcer.  So we will place on vancomycin and Zosyn, get an x-ray of the right foot and vascular consult as they have done the recent resection on the left foot.  Wound cultures and blood cultures were done.  3.  Altered mental status, likely metabolic encephalopathy secondary to above-mentioned sepsis and also hypoxia.  4.  Diabetes mellitus.  Since patient is nothing by mouth, hold his 70/30 Humulin and place him on sliding scale insulin.  5.  Cerebrovascular accident,  appears to be at baseline and has baseline confusion.  He is on aspirin and Plavix.  We will continue and CT head showing old infarcts and encephalomalacia.  The patient also has possible vascular dementia.  6.  Coronary artery disease status post bypass graft surgery, appears to be stable at this time.  7.  Elevated liver function tests, likely secondary to his acute sepsis.  We will get a right upper quadrant ultrasound and follow up in a.m.   CODE STATUS was discussed with daughter at bedside.  Since his stroke, patient is clinically going downhill, however the daughter is having a hard time accepting that and she is still hoping that he would get better and she can take him home.  Poor prognosis, so we will get a palliative care consult.   TIME SPENT ON ADMISSION:  50 minutes.     ____________________________ Enid Baas, MD rk:ea D: 10/07/2012 20:31:03 ET T: 10/08/2012 00:28:40 ET JOB#: [id number]  cc: Enid Baas, MD, <Dictator> Dr. Luetta Nutting MD ELECTRONICALLY SIGNED 10/10/2012 12:56

## 2014-11-15 NOTE — Op Note (Signed)
DATE OF BIRTH:  02-28-1941  DATE OF PROCEDURE:  08/21/2012  PREOPERATIVE DIAGNOSES:  1.  Peripheral artery disease with gangrene, left lower extremity.  2.  Peripheral artery disease with ulceration status post revascularization, right lower extremity.  3.  Stroke.  4.  Carotid disease.   POSTOPERATIVE DIAGNOSES:  1.  Peripheral artery disease with gangrene, left lower extremity.  2.  Peripheral artery disease with ulceration status post revascularization, right lower extremity.  3.  Stroke.  4.  Carotid disease.   PROCEDURE: 1.  Catheter placement to left popliteal artery from right femoral approach.  2.  Aortogram and selective left lower extremity angiogram.  3.  Percutaneous transluminal angioplasty of mid to distal left superficial femoral artery with 6-mm diameter angioplasty balloon.  4.  Self-expanding stent placement x 1 to mid to distal left SFA for greater than 50% of residual stenosis and dissection after angioplasty.  5.  StarClose closure device, right femoral artery.   SURGEON:  Annice Needy, MD  ANESTHESIA:  Local with moderate conscious sedation.   BLOOD LOSS:  Minimal.   FLUOROSCOPY TIME:  4.8 minutes.   CONTRAST USED:  62 mL.   INDICATION FOR PROCEDURE:  This is a 73 year old male known to me from multiple vascular issues. He has undergone revascularization of the right lower extremity for ulceration, which is improving, but not entirely healed. He presents with gangrenous left third toe, as well as the tip of the left second toe, and his arterial perfusion is significantly reduced on the left. It has improved on the right, but is not entirely normal. He is brought in for revascularization for an attempt at limb salvage, as at this point he would require a major amputation for wound healing due to poor perfusion. Risks and benefits were discussed. Informed consent was obtained.   DESCRIPTION OF THE PROCEDURE:  The patient was brought to the vascular  interventional radiology suite. Groins were shaved and prepped, and a sterile surgical field was created. The right femoral head was localized with fluoroscopy, and the right femoral artery was accessed without difficulty with a Seldinger needle. A J-wire and 5-French sheath were placed. Pigtail catheter was placed in the aorta at the L1-L2 level, and AP aortogram was performed. This showed no significant stenosis in the aortoiliac segments with patent renal arteries. There was a lesion in the left common iliac artery that was a not insignificant divot out of the artery, but it was not flow limiting, and this had been seen on his previous aortogram as well.Using a RIM catheter and Terumo Advantage wire I was able to cross the aortic bifurcation and advanced to the left femoral head, and selective left lower extremity angiogram was then performed. This demonstrated a pruned profunda femoris artery providing poor collateralization around an occluded SFA in the mid to distal segment. This reconstituted at Hunter's canal. Below the knee, he had occlusion of all 3 tibial vessels with extensive collateral formation present. To improve his in-flow, even with distal tibial disease, as the tibial disease was well collateralized, I thought it was reasonable to treat his SFA occlusion.   A 6-French Ansel sheath was placed over a Terumo Advantage wire, and the patient was systemically heparinized with 4000 units of intravenous heparin. I was able to cross the lesion without difficulty with a Terumo Advantage wire and a Kumpe catheter. I confirmed intraluminal flow in the popliteal artery and then replaced the wire. A 6-mm diameter angioplasty balloon was inflated encompassing the lesion.  Waist was taken. Following angioplasty, there was a dissection in the mid portion and still what appeared to be 2 areas of greater than 50% residual stenosis, and so I elected to cover this area with a 6-mm diameter x 17-cm length  self-expanding stent, ironed out with a 6-mm balloon. The angiographic result following stent placement was excellent with markedly improved flow through this and no significant residual stenosis. His tibial vessels were not going to be amenable to percutaneous revascularization, as there was not really a distal target seen, but these were extensively collateralized and hopefully will provide enough flow to heal a digital amputation. At this point, I elected to terminate the procedure. The sheath was pulled back to the ipsilateral external iliac artery and oblique arteriogram was performed. A StarClose closure device was deployed in the usual fashion with excellent hemostatic result. The patient tolerated the procedure well and was taken to the recovery room in stable condition.    ____________________________ Annice NeedyJason S. Dew, MD jsd:ms D: 08/21/2012 14:13:41 ET T: 08/22/2012 00:18:35 ET JOB#: [id number]  cc: Annice NeedyJason S. Dew, MD, <Dictator> Serita ShellerErnest B. Maryellen PileEason, MD Annice NeedyJASON S DEW MD ELECTRONICALLY SIGNED 08/23/2012 7:36

## 2014-11-15 NOTE — Op Note (Signed)
PATIENT NAME:  Benjamin Hicks, Benjamin Hicks MR#:  [id number] DATE OF BIRTH:  May 09, 1941  DATE OF PROCEDURE:  07/25/2012  PREOPERATIVE DIAGNOSES:  1.  Peripheral arterial disease with gangrene, right lower extremity.  2.  Carotid disease.  3.  Stroke.   POSTOPERATIVE DIAGNOSES:  1.  Peripheral arterial disease with gangrene, right lower extremity.  2.  Carotid disease.  3.  Stroke.   PROCEDURES:  1.  Catheter placement into right posterior tibial artery from left femoral approach.  2.  Aortogram and selective right lower extremity angiogram.  3.  Percutaneous transluminal angioplasty of long segment SFA disease with 6 mm diameter angioplasty balloon.  4.  Percutaneous transluminal angioplasty of posterior tibial artery with 3 mm diameter angioplasty balloon.  5.  StarClose closure device to left femoral artery.   SURGEON: Annice NeedyJason S. Dew, M.D.   ANESTHESIA: Local with moderate conscious sedation.   ESTIMATED BLOOD LOSS: Minimal.   INDICATION FOR PROCEDURE: This is a 74 year old African American male with gangrenous right great toe. His second toe is marginal. He has nonpalpable pedal pulses and we bring him in for angiogram for further evaluation and potential treatment. Risks and benefits are discussed. Informed consent is obtained.   DESCRIPTION OF PROCEDURE: The patient is brought to the vascular interventional radiology suite. Groins were shaved and prepped and a sterile surgical field was created. The left femoral artery was localized with fluoroscopy and the left femoral artery was accessed without difficulty with a Seldinger needle. A J-wire and 5 French sheath were placed. A pigtail catheter was placed in the aorta at the L1 level and AP aortogram was performed. This shows no flow-limiting stenosis in the aortoiliac segments. There is a mild stenosis in the left common iliac artery in the 40% range. This did not appear flow limiting and my catheter crossed this easily. The catheter may have been  slightly low. The left renal artery was seen and was patent. The right renal artery was not definitively seen, but it could have taken off at a higher orientation. There was some significant tortuosity of his aortoiliac segments. I then hooked the bifurcation. Using a Terumo Advantage wire, I was able to advance the catheter to the right femoral head. Selective right lower extremity angiogram was then performed. This showed a diffusely diseased superficial femoral artery with multiple areas of greater than 50% stenosis throughout its course. There was a takeoff basically at the level of the knee of what appeared to be a posterior tibial artery distally. This was nearly occluded proximally and had multiple areas of disease distally as well. The anterior tibial artery was chronically occluded, as was the peroneal artery. He had severe tibial disease throughout. The patient was systemically heparinized. A 6-French high-flex Ansel sheath was placed over the Terumo Advantage wire. I was able to cross the SFA lesions without difficulty. I was able to gain access to the highly stenotic posterior tibial artery with an angled catheter and the Terumo Advantage wire and able to park a wire and catheter well downstream. A 3 mm diameter angioplasty balloon was inflated in this proximal segment and good improvement was seen. There were still disease distally that was not amenable to treatment, but it was well collateralized with significantly improved flow to the foot. The SFA lesions were treated with a 6 mm diameter long angioplasty balloon from Hunter's canal up to the origin of the SFA. Following this, there was some mild residual stenosis, but no areas of greater than 20% to 30%,  and no stents were placed. At this point, I turned my attention to attempting to recanalize the long occluded peroneal and anterior tibial artery without significant success and after several minutes, it was clear this was not going to be a  successful endeavor and I elected to terminate the procedure. The sheath was removed and a StarClose closure device was deployed in the left groin with good hemostatic result. The patient tolerated the procedure well and was taken to the recovery room in stable condition.   ____________________________ Annice Needy, MD jsd:jm D: 07/25/2012 17:02:13 ET T: 07/25/2012 19:19:03 ET JOB#: [id number]  cc: Annice Needy, MD, <Dictator> Serita Sheller. Maryellen Pile, MD Annice Needy MD ELECTRONICALLY SIGNED 07/27/2012 11:26

## 2014-11-15 NOTE — Consult Note (Signed)
Impression:    74yo male w/ h/o DM, CVA, PVD, osteomyelitis, s/p amputation of several toes admitted with DVT/PE and possible osteomyelitis of the right great toe.    His admission with MS changes and SOB is likely related to his PE.  While this could also cause a rise in his WBC, his xray of the right foot showed osteomyelitis of the great toe.  While he has a chronic ulcer present, he has not had any prior surgical intervention that would explain the xray findings.    Discussed with vascular surgery.  He will discuss amputation of the toe with the patient.   Given his recent osteomyelitis requiring several surgeries and his current infection, would plan on 6 weeks of IV therapy.    Cultures have been growing Methacillin Resistant Staph aureus in the wound.  Will continue vanco.   Primary team note indicates that he had Methacillin Resistant Staph aureus in the blood, but I have confirmed with the microlab that only the wound cx is positive for Methacillin Resistant Staph aureus.   Will likely need 6 weeks of IV therapy.   d/c zosyn.   Continue contact isolation.  Electronic Signatures: Jewelianna Pancoast MPH, Rosalyn GessMichael E (MD)  (Signed on 18-Mar-14 13:50)  Authored  Last Updated: 18-Mar-14 13:50 by Hawken Bielby MPH, Rosalyn GessMichael E (MD)

## 2014-11-15 NOTE — Op Note (Signed)
PATIENT NAME:  Benjamin Hicks, Leory MR#:  [id number] DATE OF BIRTH:  03/02/1941  DATE OF PROCEDURE:  08/24/2012  PREOPERATIVE DIAGNOSES:  1.  Gangrene, left third toe.  2.  Peripheral arterial disease, status post revascularization.  3.  Stroke.  4.  Carotid disease.  POSTOPERATIVE DIAGNOSES:  1.  Gangrene, left third toe.  2.  Peripheral arterial disease, status post revascularization.  3.  Stroke.  4.  Carotid disease.  PROCEDURE: Left third ray amputation.   SURGEON: Festus BarrenJason Dew, M.D.   ANESTHESIA: General.   ESTIMATED BLOOD LOSS: Minimal.   INDICATION FOR PROCEDURE: The patient is a 74 year old [place] male with multiple ongoing issues. He has undergone percutaneous revascularization of both lower extremities. He has a gangrenous left third toe, which is painful and worsened over the last couple of weeks. He is brought in for removal of this toe in addition had an ulcer on the tip of the left second toe. This is actually improved after some revascularization and is not being amputated at this time.   DESCRIPTION OF PROCEDURE: The patient is brought to the operative suite and after an adequate level of general anesthesia was obtained, the left foot and lower extremity was sterilely prepped and draped and a sterile surgical field was created. A fishmouth type incision was created around the base of the left third toe and viable appearing tissue. He had clear gangrenous changes for about 3/4 of the toe itself. We then dissected out the toe and removed all phalanges of the toe in its entirety. The metatarsal head was then taken with large rongeurs. The wound was irrigated closed with 3 figure-of-eight Vicryl sutures in the deep tissue and 4 nylon skin sutures were used to close the skin in a mattress fashion. Xeroform, fluff Kerlix and wrap Kerlix were then placed. The patient was then awakened from anesthesia and taken to the recovery room having tolerated the procedure well.    ____________________________ Annice NeedyJason S. Dew, MD jsd:aw D: 08/24/2012 14:58:15 ET T: 08/24/2012 16:01:17 ET JOB#: [id number]  cc: Annice NeedyJason S. Dew, MD, <Dictator> Annice NeedyJASON S DEW MD ELECTRONICALLY SIGNED 08/26/2012 15:40

## 2014-11-15 NOTE — Op Note (Signed)
PATIENT NAME:  Benjamin Hicks, Benjamin Hicks MR#:  [id number] DATE OF BIRTH:  11-Jun-1941  DATE OF PROCEDURE:  10/12/2012  PREOPERATIVE DIAGNOSES:  1. Osteomyelitis, right great toe.  2. Bacteremia.  3. Status post left foot debridement and partial amputation for infection.  4. Peripheral vascular disease.  5. Stroke.  POSTOPERATIVE DIAGNOSES:  1. Osteomyelitis, right great toe.  2. Bacteremia.  3. Status post left foot debridement and partial amputation for infection.  4. Peripheral vascular disease.  5. Stroke.  PROCEDURE: Right first ray amputation.   SURGEON: Annice NeedyJason S. Octaviano Mukai, MD  ANESTHESIA: MAC.   ESTIMATED BLOOD LOSS: Minimal.   INDICATION FOR PROCEDURE: This is a gentleman with osteomyelitis of the right great toe. He has already had his left foot debrided and infection removed. This wound is looking reasonably clean now. In discussion with infectious disease physicians, removal of his right great toe was planned for removal of infectious process.   DESCRIPTION OF THE PROCEDURE: The patient was brought to the operative suite, and after an adequate level of intravenous sedation was obtained, the right foot was then was sterilely prepped and draped, and a sterile surgical field was created. The curvilinear incision was made at the base of the right first toe. We dissected out the tissues attaching the phalanges and removed the right great toe in its entirety. We then dissected down and transected the metatarsal head with heavy bone cutters, and rongeurs were used to help remove any loose flecks. The wound was then irrigated. It was closed with a series of deep interrupted 3-0 Vicryl sutures and several interrupted nylon mattress sutures. A sterile dressing was placed. The patient tolerated the procedure well.  ____________________________ Annice NeedyJason S. Lakeishia Truluck, MD jsd:OSi D: 10/13/2012 08:34:36 ET T: 10/13/2012 09:43:56 ET JOB#: [id number]  cc: Annice NeedyJason S. Baleria Wyman, MD, <Dictator> Annice NeedyJASON S Joselyn Edling MD ELECTRONICALLY  SIGNED 10/18/2012 17:10

## 2014-11-15 NOTE — Discharge Summary (Signed)
PATIENT NAME:  Benjamin Hicks, Cameo MR#:  [id number] DATE OF BIRTH:  Aug 18, 1940  DATE OF ADMISSION:  10/07/2012 DATE OF DISCHARGE:  10/18/2012  DISCHARGE DIAGNOSES:  1.  Gram-negative bacteremia, extended-spectrum beta-lactamase.  2.  Sepsis.  3.  Acute renal failure.  4.  Methicillin-resistant Staphylococcus aureus, osteomyelitis of the foot.  5.  Anemia, acute on chronic due to blood loss from the wound.  6.  Acute hypoxic respiratory failure due to bilateral pulmonary embolism.  7.  Rhabdomyolysis.  8.  Diabetes mellitus.  9.  Cerebrovascular accident.  10.  Coronary artery disease, status post coronary artery bypass grafting.  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 Wyn Quakerew and was having increased shortness of breath. Admitted with bilateral pulmonary embolism and had a long hospital course with revision of surgeries to amputate more toes and partially the foot by Vascular; and toes found having MRSA and so was on treatment with vancomycin. Also had worsening of hospital course by acute renal failure and had sepsis, and later on blood cultures found positive with ESBL and started on broad-spectrum antibiotics for that. Overall, kept gradually worsening  after discussing with the family made him a DNR patient, and finally the family decided not to proceed further with further treatments, amputation, antibiotic, but take him to hospice home and he was discharged to hospice home.  MEDICATIONS TO HOSPICE HOME: Acetaminophen suppository every 6 hours as needed for pain and fever, morphine oral solution as needed for pain and dyspnea, and lorazepam oral tablet 0.5 mg every 2 to 4 hours as needed for anxiety or agitation, oxygen as needed. TOTAL TIME SPENT IN THIS DISCHARGE: 35 minutes.      ____________________________ Hope PigeonVaibhavkumar G. Elisabeth PigeonVachhani, MD vgv:dm D: 10/22/2012 07:48:00 ET T: 10/22/2012 09:42:39 ET JOB#: [id number]  cc: Hope PigeonVaibhavkumar G. Elisabeth PigeonVachhani, MD, <Dictator> Altamese DillingVAIBHAVKUMAR Makana Rostad MD ELECTRONICALLY SIGNED 11/02/2012 14:22

## 2014-11-15 NOTE — Op Note (Signed)
PATIENT NAME:  Benjamin Hicks, Benjamin Hicks MR#:  [id number] DATE OF BIRTH:  1941/05/25  DATE OF PROCEDURE:  09/12/2012  PREOPERATIVE DIAGNOSES:  1. Diabetic left foot infection with gangrene.  2. Peripheral arterial disease. 3. Stroke.   POSTOPERATIVE DIAGNOSES:  1. Diabetic left foot infection with gangrene.  2. Peripheral arterial disease. 3. Stroke.   PROCEDURE: 1. Irrigation and excisional debridement of left foot with resection of third, fourth and fifth metatarsals and partial tarsal resection.  2. Vacuum-assisted closure dressing placement, left foot.   SURGEON: Annice NeedyJason S. Dew, MD   ANESTHESIA: General.   ESTIMATED BLOOD LOSS: Minimal.   INDICATION FOR PROCEDURE: A 74 year old African-American male with an invasive left diabetic foot infection. He has had a previous debridement last week. He still has exposed bone and exposed nonviable tissue on his dressing changes, and he is brought back for further debridement. Risks and benefits were discussed. Informed consent was obtained.   DESCRIPTION OF PROCEDURE: The patient is brought to the operative suite, and after an adequate level of general anesthesia was obtained, the left lower extremity was sterilely prepped and draped, and a sterile surgical field was created. The previous dressing had been removed. The wound was open. There was clearly some nonviable tissue on the lateral and inferior edge of the wound. This was actually taken with scalpel and heavy Mayo scissors, including skin, soft tissue and some muscle. The bone was then resected in the lateral 3 metatarsals, what appeared to be their entirety down to the tarsal bones, with the TPS oscillating saw system. Several other areas of nonviable tendons and muscular edges were debrided back with electrocautery or sharp dissection with Mayo scissors or Metzenbaum scissors. The wound was then irrigated. A medium VAC sponge was cut to fit the wound. Strips of Ioban were used to gain an occlusive seal,  which was obtained. The patient tolerated the procedure well and was taken to the recovery room in stable condition.    ____________________________ Annice NeedyJason S. Dew, MD jsd:OSi D: 09/12/2012 08:38:39 ET T: 09/12/2012 11:22:36 ET JOB#: [id number]  cc: Annice NeedyJason S. Dew, MD, <Dictator> Annice NeedyJASON S DEW MD ELECTRONICALLY SIGNED 09/18/2012 9:40

## 2014-11-15 NOTE — Consult Note (Signed)
CHIEF COMPLAINT and HISTORY:  Subjective/Chief Complaint sepsis   History of Present Illness Patient admitted with sepsis, has history of BLe ulcers.  Left foot VAC in place, no purulent drainage.  No erythema.   Right foot without tenderness or drainage.  He is nearly non-verbal and provides no history.   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 Active  docusate sodium 100 mg oral capsule 1 cap(s) orally 2 times a day for constipation. Active  simvastatin 80 mg oral tablet 1 tab(s) orally once a day (at bedtime) for cholesterol. Active  Senna S 50 mg-8.6 mg oral tablet 1 tab(s) orally once a day for stool softener. Active  Humulin 70/30 Pen subcutaneous suspension 15 unit(s) subcutaneous 2 times a day before breakfast and supper for diabetes mellitus. Active  Aspirin Enteric Coated 81 mg oral delayed release tablet 1 tab(s) orally once a day for coronary artery. Active  multivitamin 1 tab(s) orally once a day for supplement. Active  ensure plus liquid 237 milliliter(s) orally 3 times a day between meals for supplement. Active  Tylenol 325 mg oral tablet 2 tabs (640m) orally every 4 hours as needed for pain or fever.  Active   Family and Social History:  Family History Non-Contributory   Social History positive  tobacco (Current within 1 year),  negative ETOH   Place of Living Nursing Home   Review of Systems:  ROS Pt not able to provide ROS   Medications/Allergies Reviewed Medications/Allergies reviewed   Physical Exam:  GEN well developed, well nourished   HEENT pink conjunctivae, PERRL   NECK No masses  trachea midline   RESP normal resp effort  no use of accessory muscles   CARD irregular rate  no JVD   ABD denies tenderness  denies Flank Tenderness   GU clear yellow urine draining   LYMPH negative neck, negative axillae   EXTR negative cyanosis/clubbing   SKIN left lateral foot in a VAC without erythema or purulent draiange.  Right great toe with minimal drainage, non-tender   NEURO aphasic, difficult to assess due to cooperation   PSYCH poor insight   LABS:  Laboratory Results: Hepatic:    15-Mar-14 18:18, Comprehensive Metabolic Panel  Bilirubin, Total 0.8  Alkaline Phosphatase 274  SGPT (ALT) 136  SGOT (AST) 152  Total Protein, Serum 8.1  Albumin, Serum 2.1  LabUnknown:    15-Mar-14 19:41, Wound Aerobic/Anaerobic Culture  Ind. Clindamycin Resistance NEGATIVE  Routine Micro:    15-Mar-14 19:41, Blood Culture  Micro Text Report   BLOOD CULTURE    COMMENT                   NO GROWTH IN 36 HOURS     ANTIBIOTIC  Culture Comment   NO GROWTH IN 36 HOURS   Result(s) reported on 09 Oct 2012 at 10:08AM.  Micro Text Report   BLOOD CULTURE    COMMENT                   NO GROWTH IN 36 HOURS     ANTIBIOTIC  Culture Comment   NO GROWTH IN 36 HOURS   Result(s) reported on 09 Oct 2012 at 10:08AM.    15-Mar-14 19:41, Wound Aerobic/Anaerobic Culture  Organism Name   [place].AUREUS  Organism Quantity   HEAVY GROWTH  Clindamycin Sensitivity R  Oxacillin Sensitivity R  Ciprofloxacin Sensitivity R  Gentamicin Sensitivity R  Erythromycin Sensitivity R  Vancomycin Sensitivity S  Linezolid Sensitivity S  Tigecycline Sensitivity S  Trimethoprim/Sulfamethoxazole Sensitivty S   Lefofloxacin Sensitivity R  Micro Text Report   WOUND AER/ANAEROBIC CULT    ORGANISM 1                HEAVY GROWTH METHICILLIN RESISTANT STAPH.AUREUS    COMMENT                   ISOLATING 2ND POSSIBLE PATHOGEN    GRAM STAIN                RARE WHITE BLOOD CELLS SEEN    GRAM STAIN                RARE GRAM POSITIVE COCCI IN CLUSTERS SEEN     ANTIBIOTIC                    ORG#1      CIPROFLOXACIN                 R          CLINDAMYCIN                   R          ERYTHROMYCIN                  R          GENTAMICIN                    R          LEVOFLOXACIN                  R          LINEZOLID                     S          OXACILLIN                     R          TIGECYCLINE                   S          VANCOMYCIN                    S          CEFOXITIN SCREEN              POSITIVE   INDUCIBLE CLINDAMYCIN RESISTANNEGATIVE   TRIMETHOPRIM/SULFAMETHOXAZOLE S  Specimen Source   RIGHT FOOT  Organism 1   HEAVY GROWTH METHICILLIN RESISTANT STAPH.AUREUS  Culture Comment   ISOLATING 2ND POSSIBLE PATHOGEN  Gram Stain 1   RARE WHITE BLOOD CELLS SEEN  Gram Stain 2   RARE GRAM POSITIVE COCCI  IN CLUSTERS SEEN  Lab:    15-Mar-14 17:50, ABG  pH (ABG) 7.50  PCO2 34  PO2 152  FiO2 36  Base Excess 3.6  HCO3 26.5  O2 Saturation 99.9  O2 Device [place]  Specimen Site (ABG)   RT FEMORAL  Specimen Type (ABG) ARTERIAL  Patient Temp (ABG) 37.0  Result(s) reported on 07 Oct 2012 at 05:58PM.  Cardiology:    15-Mar-14 17:29, ED ECG  Ventricular Rate 125  Atrial Rate 125  P-R Interval 122  QRS Duration 94  QT 320  QTc 461  P Axis 41  R Axis 19  T Axis 81  ECG interpretation   Sinus tachycardia with Premature atrial complexes  RSR' or QR pattern in V1 suggests right ventricular conduction delay  Borderline ECG  When compared with ECG of 22-Jun-2012 12:24,  Premature atrial complexes are now Present  Vent. rate has increased BY  61 BPM  Nonspecific T wave abnormality no longer evident in  Inferior leads  Nonspecific T wave abnormality now evident in Lateral leads  ----------unconfirmed----------  Confirmed by OVERREAD, NOT ([id number]), editor PEARSON, BARBARA ([id number]) on 3/17/20149:47:05 AM  ED ECG   Routine Chem:    15-Mar-14 18:18, Comprehensive Metabolic Panel  Glucose, Serum 221  BUN 23  Creatinine (comp) 1.09  Sodium, Serum 138  Potassium, Serum 4.2  Chloride, Serum 103  CO2, Serum 27  Calcium (Total), Serum 9.5  Osmolality (calc) 286  eGFR (African American) >60  eGFR (Non-African American) >60  eGFR values <45m/min/1.73 m2 may be an indication of chronic  kidney disease (CKD).  Calculated eGFR is useful in patients with stable renal function.  The eGFR calculation will not be reliable in acutely ill patients  when serum creatinine is changing rapidly. It is not useful in   patients on dialysis. The eGFR calculation may not be applicable  to patients at the low and high extremes of body sizes, pregnant  women, and vegetarians.  Anion Gap 8    15-Mar-14 18:18, Magnesium, Serum  Magnesium, Serum 2.1  1.8-2.4  THERAPEUTIC RANGE: 4-7 mg/dL  TOXIC: > 10 mg/dL   -----------------------    15-Mar-14 19:41, Wound Aerobic/Anaerobic Culture  Result Comment   MRSA - NOTIFIED OF CRITICAL VALUE   - C/STEVEN SYKES 10/09/12 0902 JGF   - READ-BACK PROCESS PERFORMED.   - CEFOXITIN SCREEN - This test may be used   - to predict mecA-mediated oxacillin   - resistance, and it is based on the   - cefoxitin disk screen test. The cefoxitin   - screen and oxacillin work in combination   - to determine the final interpretation   - reported for oxacillin.   Result(s) reported on 09 Oct 2012 at 09:04AM.    16-Mar-14 098:33 Basic Metabolic Panel (w/Total Calcium)  Glucose, Serum 225  BUN 22  Creatinine (comp) 1.07  Sodium, Serum 139  Potassium, Serum 4.0  Chloride, Serum 106  CO2, Serum 27  Calcium (Total), Serum 8.9  Anion Gap 6  Osmolality (calc) 288  eGFR (African American)  >60  eGFR (Non-African American) >60  eGFR values <630mmin/1.73 m2 may be an indication of chronic  kidney disease (CKD).  Calculated eGFR is useful in patients with stable renal function.  The eGFR calculation will not be reliable in acutely ill patients  when serum creatinine is changing rapidly. It is not useful in   patients on dialysis. The eGFR calculation may not be applicable  to patients at the low and high  extremes of body sizes, pregnant  women, and vegetarians.    17-Mar-14 04:38, CBC Profile  Result Comment   LABS - This specimen was collected through an    - indwelling catheter or arterial line.   - A minimum of 44ms of blood was wasted prior     - to collecting the sample.  Interpret   - results with caution.   Result(s) reported on 09 Oct 2012 at 05:00AM.  Cardiac:    15-Mar-14 18:18, Cardiac Panel  CK, Total 1633  CPK-MB, Serum 5.9  Result(s) reported on 07 Oct 2012 at 10:08PM.    15-Mar-14 18:18, Troponin I  Troponin I < 0.02  0.00-0.05  0.05 ng/mL or less: NEGATIVE   Repeat testing in 3-6 hrs   if clinically indicated.  >0.05 ng/mL: POTENTIAL   MYOCARDIAL INJURY. Repeat   testing in 3-6 hrs if   clinically indicated.  NOTE: An increase or decrease   of 30% or more on serial   testing suggests a   clinically important change    16-Mar-14 02:25, Cardiac Panel  CK, Total 1213  CPK-MB, Serum 2.8  Result(s) reported on 08 Oct 2012 at 03:14AM.    16-Mar-14 02:25, Troponin I  Troponin I < 0.02  0.00-0.05  0.05 ng/mL or less: NEGATIVE   Repeat testing in 3-6 hrs   if clinically indicated.  >0.05 ng/mL: POTENTIAL   MYOCARDIAL INJURY. Repeat   testing in 3-6 hrs if   clinically indicated.  NOTE: An increase or decrease   of 30% or more on serial   testing suggests a   clinically important change    16-Mar-14 10:17, Cardiac Panel  CK, Total 1044  CPK-MB, Serum 3.7  Result(s) reported on 08 Oct 2012 at 11:49AM.    16-Mar-14 10:17, Troponin I   Troponin I < 0.02  0.00-0.05  0.05 ng/mL or less: NEGATIVE   Repeat testing in 3-6 hrs   if clinically indicated.  >0.05 ng/mL: POTENTIAL   MYOCARDIAL INJURY. Repeat   testing in 3-6 hrs if   clinically indicated.  NOTE: An increase or decrease   of 30% or more on serial   testing suggests a   clinically important change    17-Mar-14 04:38, CK, Total  CK, Total 691  Result(s) reported on 09 Oct 2012 at 05:00AM.  Routine UA:    15-Mar-14 18:30, Urinalysis  Color (UA) Amber  Clarity (UA) Cloudy  Glucose (UA) 50 mg/dL  Bilirubin (UA) Negative  Ketones (UA) Negative  Specific Gravity (UA) 1.025  Blood (UA) 2+  pH (UA) 5.0  Protein (UA)   100 mg/dL  Nitrite (UA) Negative  Leukocyte Esterase (UA) Negative  Result(s) reported on 07 Oct 2012 at 06:53PM.  RBC (UA) 2 /HPF  WBC (UA) 3 /HPF  Bacteria (UA) TRACE  Epithelial Cells (UA)   NONE SEEN  Amorphous Crystal (UA) PRESENT  Result(s) reported on 07 Oct 2012 at 06:53PM.  Routine Hem:    15-Mar-14 18:18, Hemogram, Platelet Count  WBC (CBC) 19.2  RBC (CBC) 3.20  Hemoglobin (CBC) 9.8  Hematocrit (CBC) 30.3  Platelet Count (CBC) 343  Result(s) reported on 07 Oct 2012 at 06:38PM.  MCV 95  MCH 30.7  MCHC 32.4  RDW 14.8    16-Mar-14 02:25, CBC Profile  WBC (CBC) 17.6  RBC (CBC) 2.95  Hemoglobin (CBC) 8.9  Hematocrit (CBC) 27.6  Platelet Count (CBC) 330  MCV 94  MCH 30.3  MCHC 32.4  RDW 15.0  Neutrophil %  84.3  Lymphocyte % 8.6  Monocyte % 6.6  Eosinophil % 0.0  Basophil % 0.5  Neutrophil # 14.8  Lymphocyte # 1.5  Monocyte # 1.2  Eosinophil # 0.0  Basophil # 0.1  Result(s) reported on 08 Oct 2012 at 02:47AM.    17-Mar-14 04:38, CBC Profile  WBC (CBC) 16.7  RBC (CBC) 2.69  Hemoglobin (CBC) 8.5  Hematocrit (CBC) 25.4  Platelet Count (CBC) 350  MCV 95  MCH 31.4  MCHC 33.3  RDW 15.0  Neutrophil % 87.7  Lymphocyte % 7.2  Monocyte % 4.7  Eosinophil % 0.1  Basophil % 0.3  Neutrophil # 14.6  Lymphocyte  # 1.2  Monocyte # 0.8  Eosinophil # 0.0  Basophil # 0.1   RADIOLOGY:  Radiology Results: XRay:    22-Jun-13 13:55, Chest PA and Lateral  Chest PA and Lateral  REASON FOR EXAM:    weight loss cough  COMMENTS:       PROCEDURE: DXR - DXR CHEST PA (OR AP) AND LATERAL  - Jan 15 2012  1:55PM     RESULT: Comparison is made to the previous exam of 20 March 2011.    The heart is at the upper limits of normal in size. Sternotomy wires are   present. There is no edema, infiltrate, effusion or pneumothorax. Linear   fibrosis is seen at the left costophrenic angle.    IMPRESSION:   1. Minimal left lung base atelectasis. No acute cardiopulmonary disease   otherwise.    Dictation Site: 6          Verified By: Sundra Aland, M.D., MD    24-Jul-13 17:08, Foot Right Complete  Foot Right Complete  REASON FOR EXAM:    injury  COMMENTS:   LMP: (Male)    PROCEDURE: DXR - DXR FOOT RT COMPLETE W/OBLIQUES  - Feb 16 2012  5:08PM     RESULT: Images of the right foot demonstrate no fracture, dislocation or   bony destruction. No radiopaque foreign bodyis evident.    IMPRESSION:      Please see above.      Thank you for the opportunity to contribute to the care of your patient.     Dictation Site: 1          Verified By: Sundra Aland, M.D., MD    04-Dec-13 18:54, Chest Portable Single View  Chest Portable Single View  REASON FOR EXAM:    PICC placement  COMMENTS:       PROCEDURE: DXR - DXR PORTABLE CHEST SINGLE VIEW  - Jun 28 2012  6:54PM     RESULT: Comparison: None    Findings:     Single portable AP chest radiograph is provided. There is a right-sided   PIC line with the tip projecting over the right atrium; recommend   retracting the PICC line 5 cm. There is no focal parenchymal opacity,   pleural effusion, or pneumothorax. Normal cardiomediastinal silhouette.   There is evidence of prior median sternotomy. The osseous structures are    unremarkable.  IMPRESSION:     There is a right-sided PIC line with the tip projecting over the right   atrium; recommend retracting the PICC line 5 cm.     Dictation Site: 1        Verified By: Jennette Banker, M.D., MD    15-Mar-14 17:38, Chest Portable Single View  Chest Portable Single View  REASON FOR EXAM:    sob  COMMENTS:  PROCEDURE: DXR - DXR PORTABLE CHEST SINGLE VIEW  - Oct 07 2012  5:38PM     RESULT: Comparison: 06/28/2012    Findings:  The heart is normal in size. Prior median sternotomy. No focal pulmonary   opacities.    IMPRESSION:   No acute cardiopulmonary disease.    Dictation Site: 8        Verified By: Gregor Hams, M.D., MD    15-Mar-14 21:14, Foot Right Complete  Foot Right Complete  REASON FOR EXAM:    first toe ulcer  COMMENTS:       PROCEDURE: DXR - DXR FOOT RT COMPLETE W/OBLIQUES  - Oct 07 2012  9:14PM     RESULT: Comparison: 02/16/2012    Findings:  There is cortical destruction and lucency involving the mid portion of   the distal phalanx of the great toe. The findings are consistent with   osteomyelitis given the reported great toe ulcer. No soft tissue gas   seen. There are vascular calcifications.    IMPRESSION:   Findings consistent with osteomyelitis of the distal phalanx of the great     toe.      Dictation Site: 8        Verified By: Gregor Hams, M.D., MD    17-Mar-14 01:36, Chest Portable Single View  Chest Portable Single View  REASON FOR EXAM:    PICC line placement verification  COMMENTS:       PROCEDURE: DXR - DXR PORTABLE CHEST SINGLE VIEW  - Oct 09 2012  1:36AM     RESULT: Comparison: 10/07/2012    Findings:  The heart and mediastinum are stable. Prior median sternotomyand CABG.   Right PICC catheter tip terminates at the confluence of the right   brachiocephalic vein and SVC. No focal pulmonary opacities.    IMPRESSION:   Right PICC catheter tip terminates at the confluence of the right    brachiocephalic vein and SVC.    Verified By: Gregor Hams, M.D., MD  [place]:    16-Mar-14 07:27, US Abdomen Limited Survey  US Abdomen Limited Survey  REASON FOR EXAM:    elevated lfts  COMMENTS:   Body Site: GB and Fossa, CBD, Head of Pancreas; Right Upper   Quad; Live    PROCEDURE: [place]  - US ABDOMEN LIMITED SURVEY  - Oct 08 2012  7:27AM     RESULT: Comparison: None.    Technique: Multiple grayscale and color Doppler images were obtained of   the right upper quadrant.    Findings:  The pancreas was partially obscured by overlying bowel gas. The   visualized portion the pancreatic body is unremarkable. The visualized   liver is unremarkable. Themain portal vein is patent. The common bile     duct measures 4-5 mm in diameter. There are multiple stones within the   gallbladder. No gallbladder wall thickening or pericholecystic fluid. The   sonographic Percell Miller sign was negative.    IMPRESSION:   Cholelithiasis, without sonographic evidence of acute cholecystitis.        Verified By: Gregor Hams, M.D., MD    16-Mar-14 20:39, [place] Color Flow Doppler Low Extrem Bilat (Legs)  [place] Color Flow Doppler Low Extrem Bilat (Legs)  REASON FOR EXAM:    PE. DVT?  COMMENTS:       PROCEDURE: [place]  - US DOPPLER LOW EXTR BILATERAL  - Oct 08 2012  8:39PM     RESULT: Comparison: None    Findings: Multiple  longitudinal and transverse gray-scale as well as   color and spectral Doppler images of  bilateral lower extremity veins   were obtained from the common femoral veins through the popliteal veins.    There is nonocclusive noncompressible the venous thrombosis of the   proximal superficial femoral vein and common femoral vein. The popliteal   vein is normally compressible with normal color flow, without   intraluminal thrombus is identified.There is normal respiratory variation     and augmentation demonstrated at all vein levels.     The left common femoral, greater saphenous, femoral,  popliteal veins, and   venous trifurcation are patent, demonstrating normal color-flow and   compressibility. No intraluminal thrombus is identified.There is normal   respiratory variation and augmentation demonstrated at all vein levels.    IMPRESSION:      1. Nonocclusive deep venous thrombosis of the right common femoral vein   and proximal superficial femoral vein.  2. No evidence of DVT in the left lower extremity.    Dictation Site: 1    Verified By: Jennette Banker, M.D., MD  LabUnknown:    28-Nov-13 13:16, CT Head Without Contrast  CTHEAD-1  REASON FOR EXAM:    mental status change  COMMENTS:       PROCEDURE: CT  - CT HEAD WITHOUT CONTRAST  - Jun 22 2012  1:16PM     RESULT: Head CT dated 06/22/2012 comparison made to prior study dated   01/15/2012.    Technique: Helical noncontrasted 5 mmsections were obtained from the   skull base through the vertex.    Findings: Multiple focal areas of low-attenuation are standard within the   right and left frontal lobes. When compared to previous study these   findings are unchanged. There is diffuse cortical atrophy as well as   diffuse areas of low-attenuation within the subcortical, deep, and     periventricular white matter regions. An area of low-attenuation also   projects within the left lobe of the thalamus. There is no evidence of   intra-axial nor extra-axial fluid collections nor acute hemorrhage. There   is no evidence of mass effect nor a depressed skull fracture. There does   appear to be a component of cerebellar atrophy. An area low attenuation   projects within the medialposterior aspect of the right cerebellar   hemisphere. The ethmoid sinuses demonstrate mild mucosal thickening. The   mastoid air cells are patent.    IMPRESSION:  Chronic and involutional changes without evidence of acute   abnormalities.        Verified By: Mikki Santee, M.D., MD  PACS Image    [phone] 16:29, US Carotid Doppler  Bilateral  [email or web address]  REASON FOR EXAM:    right sided weakness  COMMENTS:       PROCEDURE: [place]  - US CAROTID DOPPLER BILATERAL  - Jun 22 2012  4:29PM     RESULT:     FINDINGS:  Evaluation of the right carotid system demonstrates complete   occlusion of the internal carotidartery. Calcified and smooth plaque is   identified within the carotid bulb and common carotid artery   demonstrating approximately 50% stenosis visually in the carotid bulb and   less than 50% in the common carotid artery. Evaluation of the left   carotid system demonstrates mixed smooth and calcified plaque within the   internal carotid artery, carotid bulb and common carotid artery   demonstrating 50 to 75% stenosis in the internal carotid artery  and less     than 50% stenosis in the carotid bulb and common carotid arteries.   Elevated velocities are identified within the left mid internal carotid   artery and again concerning for hemodynamically significant stenosis.    Spectral waveform and color filling of the interrogated vessels are   unremarkable. There is lack of color filling and spectral waveform in the   internal carotid artery of the right.     ICA/CCA ratios:    LEFT:  1.64    RIGHT:  N/A    Antegrade flows is identified within the right and left vertebral     arteries.    IMPRESSION:      1.  Complete occlusion of the right internal carotid artery versus   severely high-grade stenosis.  2.  Findings concerning for hemodynamically significant stenosis within   the proximal to mid left internal carotid artery.    Thank you for this opportunity to contribute to the care of your patient.         Verified By: Mikki Santee, M.D., MD  PACS Image    [phone] 14:19, MRI Brain Without Contrast  [id number]  REASON FOR EXAM:    dysarthria, right sided weakness  COMMENTS:       PROCEDURE: MR  - MR BRAIN WO CONTRAST  - Jun 23 2012  2:19PM     RESULT: History: Right-sided weakness and  dysarthria.    Comparison Study: MRI brain of 03/30/2011.    Findings: Diffusion-weighted images reveal a new acute left thalamic   infarct. Also noted is a new small infarct in the right cerebellar   hemisphere. Prominent white matter chronic ischemic change noted. No   hydrocephalus. Vascular flow voids are normal. Orbits and paranasal   sinuses are normal. VIII nerve complexes are normal. Chronic white matter   disease in the brainstem.  .    IMPRESSION:  Left thalamic and right cerebellar acute infarcts. Severe   chronic ischemic change.        Verified By: Osa Craver, M.D., MD  PACS Image    [phone] 18:34, CT Angiography Neck (Carotids)  [email or web address]  REASON FOR EXAM:    carotid stenosis and stroke  COMMENTS:       PROCEDURE: CT  - CT ANGIOGRAPHY NECK W/CONTRAST  - Jun 23 2012  6:34PM     RESULT: Axial CT scanning was performed through the neck to evaluate the   cervical carotid system. The patient received 100 cc of Isovue 370.    The origin of the right and left common carotid arteries is normal. At   the level of the carotid bulb on the right there is complete occlusion of   the internal carotid artery. This has been previously described. There is   calcified atherosclerotic plaque here. On the left there is stenosis of   the proximal internal carotid artery just distal to the bulb with the   degree of stenosis approximately 60 to 75%. There is considerable   calcified and soft plaque at the level of the left carotid bulb.  The vertebral arteries appear patent. The right vertebral artery is   dominant. The visualized portions of the intracranial circulation appear   normal.    IMPRESSION:   1. 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 This is not a new finding.  2. There is an approximately 60 to 75% stenosis of the proximal left  internal carotid artery just distal to the bulb. Calcified and soft    plaque is present here.  3. There is flow demonstrated in the vertebral arteries bilaterally. The   right vertebral artery is dominant.     Dictation Site: 5    Verified By: DAVID A. [place], M.D., MD  PACS Image    02-Dec-13 07:56, Blood Culture  CEFOXITIN  ESBL  [email or web address]    02-Dec-13 08:20, Chest Portable Single View  DCHESTPORT  REASON FOR EXAM:    fever  COMMENTS:       PROCEDURE: DXR - DXR PORTABLE CHEST SINGLE VIEW  - Jun 26 2012  8:20AM     RESULT: Comparison: None    Findings:     Single portable AP chest radiograph is provided.  There is no focal   parenchymal opacity,pleural effusion, or pneumothorax. Normal   cardiomediastinal silhouette. There is evidence of prior median   sternotomy. The osseous structures are unremarkable.    IMPRESSION:   No acute disease of the chest.    Dictation Site: 1        Verified By: Jennette Banker, M.D., MD  PACS Image    02-Dec-13 08:40, Urine Culture  CEFOXITIN  ESBL  [email or web address]    04-Dec-13 18:54, Chest Portable Single View  PACS Image    05-Feb-14 12:15, Wound Aerobic/Anaerobic Culture  Ind. Clindamycin Resistance    15-Mar-14 17:38, Chest Portable Single View  PACS Image    15-Mar-14 18:50, CT Head Without Contrast  PACS Image    15-Mar-14 19:41, Wound Aerobic/Anaerobic Culture  Ind. Clindamycin Resistance    15-Mar-14 21:14, Foot Right Complete  PACS Image    15-Mar-14 21:32, CT Chest for Pulm Embolism With Contrast  PACS Image    16-Mar-14 07:27, US Abdomen Limited Survey  PACS Image    16-Mar-14 20:39, [place] Color Flow Doppler Low Extrem Bilat (Legs)  PACS Image    17-Mar-14 01:36, Chest Portable Single View  PACS Image  CT:    22-Jun-13 13:44, CT Head Without Contrast  CT Head Without Contrast  REASON FOR EXAM:    syncope  COMMENTS:       PROCEDURE: CT  - CT HEAD WITHOUT CONTRAST  - Jan 15 2012  1:44PM     RESULT: Emergent noncontrast CT of the brain is compared to the previous   exam dated 13 January 2010.    There is prominence of the ventricles and sulci consistent with atrophy.   Low-attenuation is seen diffusely within the periventricular and   subcortical white matter consistent with chronic small vessel ischemic   disease. There is an old right posterior parietal infarct as well as old   left and right frontal infarcts which appear to be essentially unchanged   compared to the previous CT. Low-attenuation in the basal ganglia is   consistent with a right basal ganglia lacunar infarct and possibly a     small left thalamic lacunar infarct. Low-attenuation is seen in the mid   pons as noted previously consistent with an old infarct. There is no   intracranial hemorrhage, mass effect or midline shift. The included   paranasal sinuses demonstrate thickening of the mucosa within the ethmoid   air cells. The calvarium is intact.    IMPRESSION:   1. Multifocal areas of old appearing infarct as described. Chronic small   vessel ischemic disease. Right basal ganglia and pontine lacunar infarcts   as well as a small left thalamic lacunar infarct.  No acute intracranial   abnormality evident.    Dictation Site: 1        Verified By: Sundra Aland, M.D., MD    15-Mar-14 18:50, CT Head Without Contrast  CT Head Without Contrast  REASON FOR EXAM:    ams weaknes  COMMENTS:       PROCEDURE: CT  - CT HEAD WITHOUT CONTRAST  - Oct 07 2012  6:50PM     RESULT: Comparison:  06/22/2012    Technique: Multiple axial images from the foramen magnum to the vertex   were obtained without IVcontrast.    Findings:    There is no evidence for mass effect, midline shift, or extra-axial fluid   collections. There is no evidence for space-occupying lesion,   intracranial hemorrhage, or acute cortical-based area of infarction.   Periventricular and subcortical hypoattenuation is consistent with     chronic small vessel ischemic disease. Small old lacunar infarct is seen   in the right  lobe of the thalamus. Encephalomalacia is seen from old   right parietal and bilateral frontal lobe infarcts.    The osseous structures are unremarkable.    IMPRESSION:    1. No acute intracranial process.  2. Chronic small vessel ischemic disease.    CT can underestimate ischemia in the first 24 hours after the event. If   there is clinical concern for an acute infarct, a followup MRI or repeat   CT scan in 24 hours may provide additional information.    Dictation Site: 8        Verified By: Gregor Hams, M.D., MD    15-Mar-14 21:32, CT Chest for Pulm Embolism With Contrast  CT Chest for Pulm Embolism With Contrast  REASON FOR EXAM:    tachypnea, hypoxia  COMMENTS:       PROCEDURE: CT  - CT CHEST (FOR PE) W  - Oct 07 2012  9:32PM     RESULT: Comparison: None    Technique: Multiple thin section axial images were obtained from the lung   apices to the upper abdomen following 80 ml Isovue 370 intravenous   contrast, according to the PE protocol. These images were also reviewed   on a Siemens multiplanar work station.    Findings:   There are several prominent, but not pathologically enlarged mediastinal   and right hilar lymph nodes. No axillary lymphadenopathy. Calcifications     are seen in the coronary arteries. There is mild thickening of the left   adrenal gland, without definite mass.    The thoracic aorta is normal in caliber. Evaluation of the segmental   pulmonary arteries is limited. There is a possible small embolus in a   left lower lobe segmental pulmonary artery. There are pulmonary emboli in   the right upper lobe pulmonary artery and the right interlobar pulmonary   artery.    Minimal subpleural opacities at the lung apices are likely due to   scarring. There is mild centrilobular emphysema. There is a small, 12 mm   nodular opacity at the right costophrenic angle which is likely secondary   to atelectasis. There are several subcentimeter pulmonary  nodules in the   bilateral lungs which are 5 mm or less. The central airways are patent.  No aggressive lytic or sclerotic osseous lesions are identified.    IMPRESSION:   1. Pulmonary emboli in the right upper lobe pulmonary artery and right   interlobar pulmonary artery.  2. Small nodular density at  the right costophrenic angle may be secondary   to atelectasis. However, followup noncontrast chest CT is recommended in   3 months to ensure resolution and exclude other etiology.  3. Indeterminate subcentimeter pulmonary nodules which are 5 mm or less.   Recommend attention on the followup chest CT.      Impression 1 and 2 were discussed with Dr. Gladstone Lighter at 2155   hours 10/07/2012.  Dictation Site: 8        Verified XG:XIVHSJ L. SUBER, M.D., MD   ASSESSMENT AND PLAN:  Assessment/Admission Diagnosis Admitted with sepsis.   Asked to assess the wounds. Left foot VAC clean and intact. No erythema or drainage. Right foot without clear signs of infection   Plan Would like to see the left foot at next VAC change.  Please call me to see.  Does not appear infected around wound Right foot without clear infection.   Will follow   Electronic Signatures: Algernon Huxley (MD)  (Signed 17-Mar-14 12:58)  Authored: Chief Complaint and History, PAST MEDICAL/SURGICAL HISTORY, ALLERGIES, HOME MEDICATIONS, Family and Social History, Review of Systems, Physical Exam, LABS, RADIOLOGY, Assessment and Plan   Last Updated: 17-Mar-14 12:58 by Algernon Huxley (MD)

## 2014-11-15 NOTE — Consult Note (Signed)
PATIENT NAME:  Benjamin Hicks, YUAN MR#:  [id number] DATE OF BIRTH:  Jan 11, 1941  DATE OF CONSULTATION:  10/13/2012  REFERRING PHYSICIAN: Hope Pigeon. Elisabeth Pigeon, MD  CONSULTING PHYSICIAN:  Dawn Kiper Lizabeth Leyden, MD  REASON FOR CONSULTATION: Acute renal failure.   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 The patient was found to have right first toe osteomyelitis and is status post right first toe ray amputation on 10/12/2012. He has been receiving vancomycin as well. We are now consulted for the evaluation and management of acute renal failure. On 10/07/2012, creatinine was 1.09. Creatinine has now risen to 1.75. Most recent vancomycin level was a bit high at 23. The patient has also had anemia of blood loss requiring transfusion. Dr. Leavy Cella is also following along on the case The patient has underlying dementia and unfortunately is nonverbal and therefore unable to offer any history at this point in time. In reviewing blood pressure trend, there have been periods of relative hypotension, but we have not noted MAP of less than 65 at any point in time during this hospitalization. It does not appear that he is receiving any NSAIDs. He is currently on IV fluid hydration with half-normal saline at 100 mL/hour.   PAST MEDICAL HISTORY:  1. Hypertension.  2. Diabetes mellitus.  3. Peripheral vascular disease.  4. History of CVA with right cerebellar and left thalamic infarction in November 2013, resulting in  aphasia and bedbound status.  5. Coronary artery disease status post CABG.  6. Bilateral carotid artery disease with the right side being 100% occluded, with 50% to 75% stenosis on the left side.  7. Probable vascular dementia.  8. MRSA infection of the left foot, status post partial amputation of the lateral 3 toes.   PAST SURGICAL HISTORY:  1. CABG.  2. Peripheral vascular disease with history of stent placement.  3. Left foot partial amputation with history of wound VAC placement.  4. Right first toe ray amputation, 10/12/2012.   ALLERGIES: No known drug allergies.   CURRENT INPATIENT MEDICATIONS:  1. Half-normal saline at 100 mL/hour.  2. Tylenol 650 mg p.o. q.4 hours p.r.n.  3. Norco 5/325 one to two tablets p.o. q.6 hours p.r.n.  4. Aspirin 81 mg daily.  5. Plavix 75 mg daily. 6. Colace 100 mg b.i.d.  7. Lovenox 70 mg subcutaneous q.12 hours.  8. Lorazepam 1 mg IV q.4 hours p.r.n. agitation.  9. Multivitamin 1 tablet p.o. daily.  10. Zofran 4 mg IV q.4 hours p.r.n.  11. Potassium chloride 40 mEq p.o. b.i.d. 12. Simvastatin 80 mg q.h.s.   13. Lantus 20 units subcutaneous q.24 hours.  14. Vancomycin 750 mg IV q.18 hours.  15. Warfarin 5 mg at 5:00 p.m.   SOCIAL HISTORY: The patient resides at [facility]. 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 He is apparently separated from his wife.   FAMILY HISTORY: Unable to obtain directly from the patient; however, per dictated history and physical, it appears that there  is a family history of diabetes mellitus.   REVIEW OF SYSTEMS: Unable to obtain from the patient as he is nonverbal and has aphasia.    PHYSICAL EXAMINATION:  VITAL SIGNS: Temperature 97.7, pulse 109, respirations 19, blood pressure 145/88, pulse oximetry 96% on room air.  GENERAL: Reveals a chronic ill-appearing African-American male currently in no acute  distress.  HEENT: Normocephalic, atraumatic. Spontaneous extraocular movements were noted. Pupils are equal, round and reactive to light. No scleral icterus. Conjunctivae are pale. No epistaxis noted. Difficult to assess hearing. Oral mucosa was dry.  NECK: Supple, without JVD or lymphadenopathy.  LUNGS: Clear to auscultation bilaterally with normal respiratory effort.  CARDIOVASCULAR: S1, S2, regular rate and rhythm. No rubs or gallops appreciated.  ABDOMEN: Soft, nontender, nondistended. Bowel sounds positive. No rebound or guarding. No gross organomegaly appreciated.  EXTREMITIES: No clubbing or cyanosis noted. There is trace bilateral lower extremity edema noted. In the right foot, first toe was noted to be amputated. The patient has a wound VAC in the left foot.  NEUROLOGIC: The patient is awake, but is not currently following commands for me.  SKIN: Warm and dry. No obvious rashes noted at this time.  MUSCULOSKELETAL: The patient has wound VAC in the left foot and has amputation of the right first toe.  GENITOURINARY: No suprapubic tenderness is noted at this time.  PSYCHIATRIC: Unable to assess at this time.   LABORATORY DATA: Sodium 145, potassium 3.5, chloride 114, CO2 23, BUN 24, creatinine 1.75, glucose 385. CBC shows WBC 16.7, hemoglobin 7.8, hematocrit 22, platelets 381. Vancomycin trough was 23. Lower extremity duplex reveals nonocclusive deep venous thrombosis of the right common femoral vein and proximal superficial femoral vein. No evidence of DVT in the left lower extremity. Abdominal ultrasound shows cholelithiasis without sonographic evidence of acute cholecystitis. CT chest with contrast showed pulmonary emboli in the right upper lobe pulmonary artery and right interlobar pulmonary artery. There was a small nodular density at the right costophrenic angle. There are also indeterminate subcentimeter pulmonary nodules which were 5 mm or less. Blood cultures x2 sets are negative. Wound  culture from the right foot shows heavy growth of methicillin-resistant Staphylococcus aureus and heavy growth of Enterococcus faecalis as well as Candida parapsilosis. Urinalysis showed urine protein 100 mg/dL, 2 RBCs per high-power field and 3 WBCs per high-power field.   IMPRESSION: This is a 74 year old African-American male with past medical history of hypertension, diabetes mellitus, coronary artery disease, status post coronary artery bypass graft, peripheral vascular disease, history of cerebrovascular accident with right cerebellar and left thalamic infarcts, bilateral carotid artery disease with 100% stenosis of the right carotid artery and 50% to 75% stenosis on the left side, probable vascular dementia, left foot gangrene status post left foot partial amputation, right first toe osteomyelitis status post right first toe amputation, who presented with altered mental status and hypoxia. Hospital course is complicated with lower extremity deep vein thrombosis, pulmonary embolism, acute renal failure.   1. Acute renal failure. The patient's acute renal failure appears to be multifactorial, with contributions from elevated vancomycin level, contrast exposure and concurrent illness with osteomyelitis. We will check a renal ultrasound for further evaluation. Will also check an SPEP and UPEP. Would recommend closer monitoring of vancomycin level and not allow this to rise above 20. Pharmacy is following on the case for this. Continue IV fluid hydration as you are currently doing. No indication for dialysis at this time.  2. Proteinuria. This was found on initial urinalysis.  He may have an element of diabetic nephropathy. Will check SPEP and UPEP for further evaluation. Would certainly avoid an ACE inhibitor for now given the fact that he has acute renal failure.  3. Anemia, not otherwise specified. Blood loss is likely contributing as he was found to have some blood in the wound VAC container. We will  check SPEP, UPEP and iron studies.  4. Right first toe osteomyelitis status post right first toe amputation. The patient is being treated with vancomycin. Dr. Leavy CellaBlocker is also following. Would certainly continue to monitor vancomycin levels closely and avoid letting the trough rise above 20. Pharmacy is also following with this issue.  5. Overall prognosis is quite guarded as the patient has a number of comorbidities and now has acute renal failure and osteomyelitis.  6. I would like to thank Dr. Elisabeth PigeonVachhani for this kind referral.     ____________________________ Lennox PippinsMunsoor N. Galaxy Borden, MD mnl:OSi D: 10/13/2012 11:39:56 ET T: 10/13/2012 12:07:30 ET JOB#: [id number]  cc: Lennox PippinsMunsoor N. Jovonte Commins, MD, <Dictator> Ria CommentMUNSOOR N Toshiye Kever MD ELECTRONICALLY SIGNED 10/27/2012 11:33

## 2014-11-15 NOTE — Discharge Summary (Signed)
PATIENT NAME:  Benjamin Hicks, [place] MR#:  [id number] DATE OF BIRTH:  [birth year]  DATE OF ADMISSION:  10/07/2012 DATE OF DISCHARGE:  10/18/2012  DISCHARGE DIAGNOSES:  1.  Gram-negative bacteremia, extended-spectrum beta-lactamase.  2.  Sepsis.  3.  Acute renal failure.  4.  Methicillin-resistant Staphylococcus aureus, osteomyelitis of the foot.  5.  Anemia, acute on chronic due to blood loss from the wound.  6.  Acute hypoxic respiratory failure due to bilateral pulmonary embolism.  7.  Rhabdomyolysis.  8.  Diabetes mellitus.  9.  Cerebrovascular accident.  10.  Coronary artery disease, status post coronary artery bypass grafting.  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 Wyn Quakerew and was having increased shortness of breath. Admitted with bilateral pulmonary embolism and had a long hospital course with revision of surgeries to amputate more toes and partially the foot by Vascular; and toes found having MRSA and so was on treatment with vancomycin. Also had worsening of hospital course by acute renal failure and had sepsis, and later on blood cultures found positive with ESBL and started on broad-spectrum antibiotics for that. Overall, kept gradually worsening (Dictation Anomaly) <<MISSING TEXT>> after discussing with the family made him a DNR patient, and finally the family decided not to proceed further with further treatments, amputation, antibiotic, but take him to hospice home and he was discharged to hospice home.  MEDICATIONS TO HOSPICE HOME: Acetaminophen suppository every 6 hours as needed for pain and fever, morphine oral solution as needed for pain and dyspnea, and lorazepam oral tablet 0.5 mg every 2 to 4 hours as needed for anxiety or agitation, oxygen as needed, and (Dictation Anomaly) <<MISSING TEXT>> for urinary  incontinence (Dictation Anomaly) <<MISSING TEXT>>.  TOTAL TIME SPENT IN THIS DISCHARGE: 35 minutes.     ____________________________ Hope PigeonVaibhavkumar G. Elisabeth PigeonVachhani, MD vgv:dm D: 10/22/2012 07:48:59 ET T: 10/22/2012 09:42:39 ET JOB#: [id number]  cc: Hope PigeonVaibhavkumar G. Elisabeth PigeonVachhani, MD, <Dictator>
